# Patient Record
Sex: Male | Born: 1960 | Race: White | Hispanic: No | Marital: Married | Smoking: Former smoker
Health system: Southern US, Community
[De-identification: ages and names within clinical notes are randomized; demographics above are authoritative.]

## PROBLEM LIST (undated history)

## (undated) DIAGNOSIS — I861 Scrotal varices: Secondary | ICD-10-CM

## (undated) DIAGNOSIS — E785 Hyperlipidemia, unspecified: Secondary | ICD-10-CM

## (undated) DIAGNOSIS — I10 Essential (primary) hypertension: Secondary | ICD-10-CM

## (undated) HISTORY — PX: OTHER SURGICAL HISTORY: SHX169

## (undated) HISTORY — DX: Essential (primary) hypertension: I10

## (undated) HISTORY — PX: BACK SURGERY: SHX140

## (undated) HISTORY — DX: Hyperlipidemia, unspecified: E78.5

## (undated) HISTORY — DX: Scrotal varices: I86.1

---

## 1998-01-01 ENCOUNTER — Ambulatory Visit (HOSPITAL_COMMUNITY): Admission: RE | Admit: 1998-01-01 | Discharge: 1998-01-01 | Payer: Self-pay | Admitting: Gynecology

## 2009-02-13 ENCOUNTER — Encounter: Admission: RE | Admit: 2009-02-13 | Discharge: 2009-02-13 | Payer: Self-pay | Admitting: Sports Medicine

## 2010-01-04 ENCOUNTER — Ambulatory Visit: Payer: Self-pay | Admitting: Emergency Medicine

## 2010-01-04 DIAGNOSIS — I10 Essential (primary) hypertension: Secondary | ICD-10-CM | POA: Insufficient documentation

## 2010-04-01 NOTE — Assessment & Plan Note (Signed)
Summary: VIRUS?/TM   Vital Signs:  Patient Profile:   50 Years Old Male CC:      eye pain/pressure, sinus pressure Height:     71 inches Weight:      199 pounds O2 Sat:      99 % O2 treatment:    Room Air Temp:     98.7 degrees F oral Pulse rate:   79 / minute Resp:     16 per minute BP sitting:   138 / 88  (left arm) Cuff size:   regular  Pt. in pain?   no  Vitals Entered By: Lajean Saver RN (January 04, 2010 10:30 AM)                   Updated Prior Medication List: LISINOPRIL 10 MG TABS (LISINOPRIL) once daily CLARITIN 10 MG TABS (LORATADINE) once daily PROPECIA 1 MG TABS (FINASTERIDE)   Current Allergies: ! * DUST, POLLENHistory of Present Illness History from: patient Chief Complaint: eye pain/pressure, sinus pressure History of Present Illness: Patient complains of onset of cold symptoms for 4 days.  They have been using Claritin and Benedryl which is helping a little bit.  His wife has similar symptoms and was given Amox 2 days ago here. + sore throat + cough No pleuritic pain No wheezing +  nasal congestion No post-nasal drainage + sinus pain/pressure + itchy/red eyes No earache No hemoptysis No SOB No chills/sweats No fever No nausea No vomiting No abdominal pain No diarrhea No skin rashes No fatigue No myalgias No headache   REVIEW OF SYSTEMS Constitutional Symptoms      Denies fever, chills, night sweats, weight loss, weight gain, and fatigue.  Eyes       Complains of eye pain.      Denies change in vision, eye discharge, glasses, contact lenses, and eye surgery.      Comments: eye pressure Ear/Nose/Throat/Mouth       Complains of frequent runny nose and sinus problems.      Denies hearing loss/aids, change in hearing, ear pain, ear discharge, dizziness, frequent nose bleeds, sore throat, hoarseness, and tooth pain or bleeding.  Respiratory       Denies dry cough, productive cough, wheezing, shortness of breath, asthma, bronchitis, and  emphysema/COPD.  Cardiovascular       Denies murmurs, chest pain, and tires easily with exhertion.    Gastrointestinal       Denies stomach pain, nausea/vomiting, diarrhea, constipation, blood in bowel movements, and indigestion. Genitourniary       Denies painful urination, kidney stones, and loss of urinary control. Neurological       Denies paralysis, seizures, and fainting/blackouts. Musculoskeletal       Denies muscle pain, joint pain, joint stiffness, decreased range of motion, redness, swelling, muscle weakness, and gout.  Skin       Denies bruising, unusual mles/lumps or sores, and hair/skin or nail changes.  Psych       Denies mood changes, temper/anger issues, anxiety/stress, speech problems, depression, and sleep problems. Other Comments: Symptoms x yesterday, patine's wife was seen 2 days ago for same symptoms   Past History:  Past Medical History: Hypertension  Past Surgical History: Ear Sx Radical mastoidectomy 1978 and 1983  Family History: None  Social History: Never Smoked Alcohol use-yes 6/week Drug use-no Smoking Status:  never Drug Use:  no Physical Exam General appearance: well developed, well nourished, no acute distress Ears: normal, no lesions or deformities Nasal: mucosa  pink, nonedematous, no septal deviation, turbinates normal Oral/Pharynx: tongue normal, posterior pharynx without erythema or exudate Neck: neck supple,  trachea midline, no masses Chest/Lungs: no rales, wheezes, or rhonchi bilateral, breath sounds equal without effort Heart: regular rate and  rhythm, no murmur Skin: no obvious rashes or lesions MSE: oriented to time, place, and person Assessment New Problems: UPPER RESPIRATORY INFECTION, ACUTE (ICD-465.9) HYPERTENSION (ICD-401.9)   Patient Education: Patient and/or caregiver instructed in the following: rest, fluids, Ibuprofen prn.  Plan New Medications/Changes: AUGMENTIN 875-125 MG TABS (AMOXICILLIN-POT CLAVULANATE)  1 tab by mouth two times a day for 10 days  #20 x 0, 01/04/2010, Hoyt Koch MD  New Orders: New Patient Level III [phone] Planning Comments:   1)  Take the prescribed antibiotic as instructed. 2)  Use nasal saline solution (over the counter) at least 3 times a day. 3)  Use over the counter decongestants like Zyrtec-D every 12 hours as needed to help with congestion. 4)  Can take tylenol every 6 hours or motrin every 8 hours for pain or fever. 5)  Follow up with your primary doctor  if no improvement in 5-7 days, sooner if increasing pain, fever, or new symptoms.     The patient and/or caregiver has been counseled thoroughly with regard to medications prescribed including dosage, schedule, interactions, rationale for use, and possible side effects and they verbalize understanding.  Diagnoses and expected course of recovery discussed and will return if not improved as expected or if the condition worsens. Patient and/or caregiver verbalized understanding.  Prescriptions: AUGMENTIN 875-125 MG TABS (AMOXICILLIN-POT CLAVULANATE) 1 tab by mouth two times a day for 10 days  #20 x 0   Entered and Authorized by:   Hoyt Koch MD   Signed by:   Hoyt Koch MD on 01/04/2010   Method used:   Print then Give to Patient   RxID:   [id number]   Orders Added: 1)  New Patient Level III [40347]

## 2011-03-05 ENCOUNTER — Other Ambulatory Visit: Payer: Self-pay | Admitting: Gastroenterology

## 2011-03-05 DIAGNOSIS — R1011 Right upper quadrant pain: Secondary | ICD-10-CM

## 2011-03-09 ENCOUNTER — Ambulatory Visit
Admission: RE | Admit: 2011-03-09 | Discharge: 2011-03-09 | Disposition: A | Discharge status: Home / Self Care | Payer: 59 | Source: Ambulatory Visit | Attending: Gastroenterology | Admitting: Gastroenterology

## 2011-03-09 DIAGNOSIS — R1011 Right upper quadrant pain: Secondary | ICD-10-CM

## 2011-04-01 ENCOUNTER — Other Ambulatory Visit: Payer: Self-pay | Admitting: Gastroenterology

## 2011-04-01 DIAGNOSIS — N281 Cyst of kidney, acquired: Secondary | ICD-10-CM

## 2011-04-01 DIAGNOSIS — R1011 Right upper quadrant pain: Secondary | ICD-10-CM

## 2011-04-06 ENCOUNTER — Other Ambulatory Visit: Payer: 59

## 2011-08-31 ENCOUNTER — Ambulatory Visit: Payer: 59 | Attending: Family Medicine | Admitting: Physical Therapy

## 2011-08-31 DIAGNOSIS — M545 Low back pain, unspecified: Secondary | ICD-10-CM | POA: Insufficient documentation

## 2011-08-31 DIAGNOSIS — M2560 Stiffness of unspecified joint, not elsewhere classified: Secondary | ICD-10-CM | POA: Insufficient documentation

## 2011-08-31 DIAGNOSIS — IMO0001 Reserved for inherently not codable concepts without codable children: Secondary | ICD-10-CM | POA: Insufficient documentation

## 2011-08-31 DIAGNOSIS — M6281 Muscle weakness (generalized): Secondary | ICD-10-CM | POA: Insufficient documentation

## 2011-09-02 ENCOUNTER — Ambulatory Visit: Payer: 59 | Admitting: Physical Therapy

## 2011-09-07 ENCOUNTER — Ambulatory Visit: Payer: 59 | Admitting: Physical Therapy

## 2011-09-11 ENCOUNTER — Ambulatory Visit: Payer: 59 | Admitting: Physical Therapy

## 2011-09-15 ENCOUNTER — Ambulatory Visit: Payer: 59 | Admitting: Physical Therapy

## 2011-09-18 ENCOUNTER — Ambulatory Visit: Payer: 59 | Admitting: Physical Therapy

## 2011-09-21 ENCOUNTER — Encounter: Payer: 59 | Admitting: Physical Therapy

## 2011-09-22 ENCOUNTER — Ambulatory Visit: Payer: 59 | Admitting: Physical Therapy

## 2011-09-23 ENCOUNTER — Encounter: Payer: 59 | Admitting: Physical Therapy

## 2011-09-24 ENCOUNTER — Ambulatory Visit: Payer: 59 | Admitting: Physical Therapy

## 2014-01-11 ENCOUNTER — Other Ambulatory Visit: Payer: Self-pay | Admitting: Gastroenterology

## 2014-01-11 DIAGNOSIS — R935 Abnormal findings on diagnostic imaging of other abdominal regions, including retroperitoneum: Secondary | ICD-10-CM

## 2014-02-05 LAB — HM COLONOSCOPY

## 2014-02-12 ENCOUNTER — Ambulatory Visit
Admission: RE | Admit: 2014-02-12 | Discharge: 2014-02-12 | Disposition: A | Discharge status: Home / Self Care | Payer: 59 | Source: Ambulatory Visit | Attending: Gastroenterology | Admitting: Gastroenterology

## 2014-02-12 DIAGNOSIS — R935 Abnormal findings on diagnostic imaging of other abdominal regions, including retroperitoneum: Secondary | ICD-10-CM

## 2014-10-17 ENCOUNTER — Emergency Department (HOSPITAL_BASED_OUTPATIENT_CLINIC_OR_DEPARTMENT_OTHER)
Admission: EM | Admit: 2014-10-17 | Discharge: 2014-10-17 | Disposition: A | Discharge status: Home / Self Care | Payer: 59 | Attending: Emergency Medicine | Admitting: Emergency Medicine

## 2014-10-17 DIAGNOSIS — S91115A Laceration without foreign body of left lesser toe(s) without damage to nail, initial encounter: Secondary | ICD-10-CM | POA: Insufficient documentation

## 2014-10-17 DIAGNOSIS — W228XXA Striking against or struck by other objects, initial encounter: Secondary | ICD-10-CM | POA: Diagnosis not present

## 2014-10-17 DIAGNOSIS — Y998 Other external cause status: Secondary | ICD-10-CM | POA: Diagnosis not present

## 2014-10-17 DIAGNOSIS — Z23 Encounter for immunization: Secondary | ICD-10-CM | POA: Diagnosis not present

## 2014-10-17 DIAGNOSIS — Y9389 Activity, other specified: Secondary | ICD-10-CM | POA: Insufficient documentation

## 2014-10-17 DIAGNOSIS — Y9289 Other specified places as the place of occurrence of the external cause: Secondary | ICD-10-CM | POA: Diagnosis not present

## 2014-10-17 DIAGNOSIS — S91119A Laceration without foreign body of unspecified toe without damage to nail, initial encounter: Secondary | ICD-10-CM

## 2014-10-17 DIAGNOSIS — Z79899 Other long term (current) drug therapy: Secondary | ICD-10-CM | POA: Diagnosis not present

## 2014-10-17 MED ORDER — LIDOCAINE HCL (PF) 1 % IJ SOLN
5.0000 mL | Freq: Once | INTRAMUSCULAR | Status: AC
  Administered 2014-10-17: 5 mL via INTRADERMAL
  Filled 2014-10-17: qty 5

## 2014-10-17 MED ORDER — TETANUS-DIPHTH-ACELL PERTUSSIS 5-2.5-18.5 LF-MCG/0.5 IM SUSP
0.5000 mL | Freq: Once | INTRAMUSCULAR | Status: AC
  Administered 2014-10-17: 0.5 mL via INTRAMUSCULAR
  Filled 2014-10-17: qty 0.5

## 2014-10-17 NOTE — ED Notes (Signed)
Pt states that he hit left pinky toe on top of toilet seat that was down on floor, bleeding controlled

## 2014-10-17 NOTE — Discharge Instructions (Signed)
Suture removal 7-10 days. ° °Laceration Care, Adult °A laceration is a cut that goes through all layers of the skin. The cut goes into the tissue beneath the skin. °HOME CARE °For stitches (sutures) or staples: °· Keep the cut clean and dry. °· If you have a bandage (dressing), change it at least once a day. Change the bandage if it gets wet or dirty, or as told by your doctor. °· Wash the cut with soap and water 2 times a day. Rinse the cut with water. Pat it dry with a clean towel. °· Put a thin layer of medicated cream on the cut as told by your doctor. °· You may shower after the first 24 hours. Do not soak the cut in water until the stitches are removed. °· Only take medicines as told by your doctor. °· Have your stitches or staples removed as told by your doctor. °For skin adhesive strips: °· Keep the cut clean and dry. °· Do not get the strips wet. You may take a bath, but be careful to keep the cut dry. °· If the cut gets wet, pat it dry with a clean towel. °· The strips will fall off on their own. Do not remove the strips that are still stuck to the cut. °For wound glue: °· You may shower or take baths. Do not soak or scrub the cut. Do not swim. Avoid heavy sweating until the glue falls off on its own. After a shower or bath, pat the cut dry with a clean towel. °· Do not put medicine on your cut until the glue falls off. °· If you have a bandage, do not put tape over the glue. °· Avoid lots of sunlight or tanning lamps until the glue falls off. Put sunscreen on the cut for the first year to reduce your scar. °· The glue will fall off on its own. Do not pick at the glue. °You may need a tetanus shot if: °· You cannot remember when you had your last tetanus shot. °· You have never had a tetanus shot. °If you need a tetanus shot and you choose not to have one, you may get tetanus. Sickness from tetanus can be serious. °GET HELP RIGHT AWAY IF:  °· Your pain does not get better with medicine. °· Your arm, hand,  leg, or foot loses feeling (numbness) or changes color. °· Your cut is bleeding. °· Your joint feels weak, or you cannot use your joint. °· You have painful lumps on your body. °· Your cut is red, puffy (swollen), or painful. °· You have a red line on the skin near the cut. °· You have yellowish-white fluid (pus) coming from the cut. °· You have a fever. °· You have a bad smell coming from the cut or bandage. °· Your cut breaks open before or after stitches are removed. °· You notice something coming out of the cut, such as wood or glass. °· You cannot move a finger or toe. °MAKE SURE YOU:  °· Understand these instructions. °· Will watch your condition. °· Will get help right away if you are not doing well or get worse. °Document Released: 08/05/2007 Document Revised: 05/11/2011 Document Reviewed: 08/12/2010 °[employer]® Patient Information ©2015 [employer]. This information is not intended to replace advice given to you by your health care provider. Make sure you discuss any questions you have with your health care provider. ° °

## 2014-10-17 NOTE — ED Provider Notes (Signed)
CSN: [id number]     Arrival date & time 10/17/14  7829 History   First MD Initiated Contact with Patient 10/17/14 0654     Chief Complaint  Patient presents with  . Laceration      HPI  Patient presents with a laceration to the left small toe after striking it on the edge of a piece of toilet that was being repaired. Bleeding at home. Controlled with pressure.  No past medical history on file. No past surgical history on file. No family history on file. Social History  Substance Use Topics  . Smoking status: Not on file  . Smokeless tobacco: Not on file  . Alcohol Use: Not on file    Review of Systems  Constitutional: Negative for fever, chills, diaphoresis, appetite change and fatigue.  HENT: Negative for mouth sores, sore throat and trouble swallowing.   Eyes: Negative for visual disturbance.  Respiratory: Negative for cough, chest tightness, shortness of breath and wheezing.   Cardiovascular: Negative for chest pain.  Gastrointestinal: Negative for nausea, vomiting, abdominal pain, diarrhea and abdominal distention.  Endocrine: Negative for polydipsia, polyphagia and polyuria.  Genitourinary: Negative for dysuria, frequency and hematuria.  Musculoskeletal: Negative for gait problem.  Skin: Positive for wound. Negative for color change, pallor and rash.  Neurological: Negative for dizziness, syncope, light-headedness and headaches.  Hematological: Does not bruise/bleed easily.  Psychiatric/Behavioral: Negative for behavioral problems and confusion.      Allergies  Review of patient's allergies indicates not on file.  Home Medications   Prior to Admission medications   Medication Sig Start Date End Date Taking? Authorizing Provider  finasteride (PROSCAR) 5 MG tablet Take 5 mg by mouth daily.   Yes Historical Provider, MD  lansoprazole (PREVACID) 30 MG capsule Take 30 mg by mouth daily at 12 noon.   Yes Historical Provider, MD  lisinopril (PRINIVIL,ZESTRIL) 20 MG  tablet Take 20 mg by mouth daily.   Yes Historical Provider, MD   BP 131/87 mmHg  Pulse 71  Temp(Src) 98.2 F (36.8 C) (Oral)  Resp 16  Ht  (1.803 m)  Wt 190 lb (86.183 kg)  BMI 26.51 kg/m2  SpO2 99% Physical Exam  Constitutional: He is oriented to person, place, and time. He appears well-developed and well-nourished. No distress.  HENT:  Head: Normocephalic.  Eyes: Conjunctivae are normal. Pupils are equal, round, and reactive to light. No scleral icterus.  Neck: Normal range of motion. Neck supple. No thyromegaly present.  Cardiovascular: Normal rate and regular rhythm.  Exam reveals no gallop and no friction rub.   No murmur heard. Pulmonary/Chest: Effort normal and breath sounds normal. No respiratory distress. He has no wheezes. He has no rales.  Abdominal: Soft. Bowel sounds are normal. He exhibits no distension. There is no tenderness. There is no rebound.  Musculoskeletal: Normal range of motion.  Neurological: He is alert and oriented to person, place, and time.  Skin: Skin is warm and dry. No rash noted.  Laceration to the left fifth toe. Adjacent the nail medially extending to the distal aspect  Psychiatric: He has a normal mood and affect. His behavior is normal.    ED Course  Procedures (including critical care time) Labs Review Labs Reviewed - No data to display  Imaging Review No results found. I have personally reviewed and evaluated these images and lab results as part of my medical decision-making.   EKG Interpretation None      MDM   Final diagnoses:  Toe laceration,  initial encounter    Nail and bed are intact. No deformity. After anesthesia the toe was prepped and draped. Tourniquet was applied. Explored. No obvious bony deformity. No bone fragments. No instability.  LACERATION REPAIR Performed by: Claudean Kinds Authorized by: Claudean Kinds Consent: Verbal consent obtained. Risks and benefits: risks, benefits and alternatives  were discussed Consent given by: patient Patient identity confirmed: provided demographic data Prepped and Draped in normal sterile fashion Wound explored  Laceration Location: lt 5th toe  Laceration Length: 1cm  No Foreign Bodies seen or palpated  Anesthesia: local infiltration  Local anesthetic: lidocaine 1% c epinephrine  Anesthetic total: 4 ml  Irrigation method: syringe Amount of cleaning: standard  Skin closure: 5-0 Prolene  Number of sutures: 4  Technique: simple interrumpted  Patient tolerance: Patient tolerated the procedure well with no immediate complications.     Rolland Porter, MD 10/17/14 [phone]

## 2015-06-12 LAB — CBC AND DIFFERENTIAL
HCT: 48 (ref 41–53)
Hemoglobin: 15.3 (ref 13.5–17.5)
Platelets: 206 (ref 150–399)
WBC: 5.9

## 2015-06-12 LAB — HEPATIC FUNCTION PANEL
ALT: 24 (ref 10–40)
AST: 19 (ref 14–40)
Alkaline Phosphatase: 66 (ref 25–125)

## 2015-06-12 LAB — LIPID PANEL
CHOLESTEROL: 248 — AB (ref 0–200)
HDL: 72 — AB (ref 35–70)
LDL Cholesterol: 158
TRIGLYCERIDES: 91 (ref 40–160)

## 2015-06-12 LAB — BASIC METABOLIC PANEL
BUN: 13 (ref 4–21)
CREATININE: 0.9 (ref 0.6–1.3)
Glucose: 93
POTASSIUM: 4.4 (ref 3.4–5.3)
Sodium: 143 (ref 137–147)

## 2015-08-02 ENCOUNTER — Other Ambulatory Visit: Payer: Self-pay | Admitting: Gastroenterology

## 2015-08-02 DIAGNOSIS — R1011 Right upper quadrant pain: Secondary | ICD-10-CM

## 2015-08-13 ENCOUNTER — Ambulatory Visit
Admission: RE | Admit: 2015-08-13 | Discharge: 2015-08-13 | Disposition: A | Discharge status: Home / Self Care | Payer: 59 | Source: Ambulatory Visit | Attending: Gastroenterology | Admitting: Gastroenterology

## 2015-08-13 DIAGNOSIS — R1011 Right upper quadrant pain: Secondary | ICD-10-CM

## 2016-02-08 DIAGNOSIS — J111 Influenza due to unidentified influenza virus with other respiratory manifestations: Secondary | ICD-10-CM | POA: Diagnosis not present

## 2016-02-18 DIAGNOSIS — J069 Acute upper respiratory infection, unspecified: Secondary | ICD-10-CM | POA: Diagnosis not present

## 2016-03-02 HISTORY — PX: OTHER SURGICAL HISTORY: SHX169

## 2016-03-19 DIAGNOSIS — M79674 Pain in right toe(s): Secondary | ICD-10-CM | POA: Diagnosis not present

## 2016-03-19 DIAGNOSIS — M79675 Pain in left toe(s): Secondary | ICD-10-CM | POA: Diagnosis not present

## 2016-03-27 DIAGNOSIS — Z23 Encounter for immunization: Secondary | ICD-10-CM | POA: Diagnosis not present

## 2016-05-11 DIAGNOSIS — I10 Essential (primary) hypertension: Secondary | ICD-10-CM | POA: Diagnosis not present

## 2016-05-11 DIAGNOSIS — L209 Atopic dermatitis, unspecified: Secondary | ICD-10-CM | POA: Diagnosis not present

## 2016-05-25 DIAGNOSIS — J069 Acute upper respiratory infection, unspecified: Secondary | ICD-10-CM | POA: Diagnosis not present

## 2016-05-25 DIAGNOSIS — R05 Cough: Secondary | ICD-10-CM | POA: Diagnosis not present

## 2016-09-07 DIAGNOSIS — G8929 Other chronic pain: Secondary | ICD-10-CM | POA: Diagnosis not present

## 2016-09-07 DIAGNOSIS — M2022 Hallux rigidus, left foot: Secondary | ICD-10-CM | POA: Diagnosis not present

## 2016-09-07 DIAGNOSIS — M79672 Pain in left foot: Secondary | ICD-10-CM | POA: Diagnosis not present

## 2016-10-27 DIAGNOSIS — G8918 Other acute postprocedural pain: Secondary | ICD-10-CM | POA: Diagnosis not present

## 2016-10-27 DIAGNOSIS — M2022 Hallux rigidus, left foot: Secondary | ICD-10-CM | POA: Diagnosis not present

## 2016-12-09 DIAGNOSIS — M2022 Hallux rigidus, left foot: Secondary | ICD-10-CM | POA: Diagnosis not present

## 2017-01-07 ENCOUNTER — Encounter: Payer: Self-pay | Admitting: Family Medicine

## 2017-01-07 ENCOUNTER — Ambulatory Visit: Payer: BLUE CROSS/BLUE SHIELD | Admitting: Family Medicine

## 2017-01-07 VITALS — BP 128/75 | HR 73 | Ht 70.5 in | Wt 195.0 lb

## 2017-01-07 DIAGNOSIS — Z125 Encounter for screening for malignant neoplasm of prostate: Secondary | ICD-10-CM

## 2017-01-07 DIAGNOSIS — I10 Essential (primary) hypertension: Secondary | ICD-10-CM | POA: Diagnosis not present

## 2017-01-07 DIAGNOSIS — B009 Herpesviral infection, unspecified: Secondary | ICD-10-CM | POA: Diagnosis not present

## 2017-01-07 DIAGNOSIS — K2100 Gastro-esophageal reflux disease with esophagitis, without bleeding: Secondary | ICD-10-CM

## 2017-01-07 DIAGNOSIS — R0981 Nasal congestion: Secondary | ICD-10-CM

## 2017-01-07 DIAGNOSIS — K1370 Unspecified lesions of oral mucosa: Secondary | ICD-10-CM | POA: Diagnosis not present

## 2017-01-07 MED ORDER — VALACYCLOVIR HCL 1 G PO TABS: 1000.0000 mg | ORAL_TABLET | Freq: Two times a day (BID) | ORAL | 5 refills | Status: DC | PRN

## 2017-01-07 NOTE — Progress Notes (Signed)
Subjective:    Patient ID: Ethan Bennett, male    DOB: 15-Apr-1960, 56 y.o.   MRN: [id number]  HPI 56 year old male is here today to establish care.  His wife Consuella Loselaine and son are current patient's here.  He has a history of high blood pressure and has been on medication for several years.  He is currently on 10 mg and is doing well on that without any problems.  He also reports a history of chronic hair loss and has been on finasteride for about the last 8 years.  He splits the tab.  Reflux/GERD-he was diagnosed formally about 2 years ago but actually is probably had symptoms on and off for about the last 5 years.  He does go on a PPI intermittently and sometimes has symptoms at night  In particular today he would like to discuss some sores that he breaks out with on his mouth and tongue.  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  He said that since he was about 56 years old but starting about 8 years ago he started noticing that he would occasionally get a breakout of what feel almost like cuts in his mouth and his throat particularly on the tongue and throat.  He says the acyclovir which normally helps his cold sores does not seem to improve the mouth symptoms and that these break out at different times.  He gets about 4 episodes per year.  He often gets associated nasal congestion and excess mucus production around that time.  His wife would like him checked for CMV and Epstein-Barr virus.   Review of Systems  Constitutional: Negative for diaphoresis, fever and unexpected weight change.  HENT: Negative for hearing loss, rhinorrhea, sneezing and tinnitus.   Eyes: Negative for visual disturbance.  Respiratory: Negative for cough and wheezing.   Cardiovascular: Negative for chest pain and palpitations.  Gastrointestinal: Negative for blood in stool, diarrhea, nausea and vomiting.  Genitourinary: Negative for discharge and dysuria.  Musculoskeletal: Negative  for arthralgias and myalgias.  Skin: Negative for rash.  Neurological: Negative for headaches.  Hematological: Negative for adenopathy.  Psychiatric/Behavioral: Negative for dysphoric mood and sleep disturbance. The patient is not nervous/anxious.      BP 128/75   Pulse 73   Ht 5' 10.5" (1.791 m)   Wt 195 lb (88.5 kg)   BMI 27.58 kg/m     Allergies not on file  Past Medical History:  Diagnosis Date  . Hyperlipidemia   . Hypertension     Past Surgical History:  Procedure Laterality Date  . radical mastoidectomy      Social History   Socioeconomic History  . Marital status: Married    Spouse name: Not on file  . Number of children: Not on file  . Years of education: Not on file  . Highest education level: Not on file  Social Needs  . Financial resource strain: Not on file  . Food insecurity - worry: Not on file  . Food insecurity - inability: Not on file  . Transportation needs - medical: Not on file  . Transportation needs - non-medical: Not on file  Occupational History  . Not on file  Tobacco Use  . Smoking status: Former Smoker    Last attempt to quit: 01/07/1981    Years since quitting: 36.0  . Smokeless tobacco: Former Engineer, [employer]  Substance and Sexual Activity  . Alcohol use: Yes    Comment: 8-10  drinks a week   . Drug use: No  . Sexual activity: Yes  Other Topics Concern  . Not on file  Social History Narrative  . Not on file    No family history on file.  Outpatient Encounter Medications as of 01/07/2017  Medication Sig  . finasteride (PROSCAR) 5 MG tablet Take 5 mg by mouth daily.  . lansoprazole (PREVACID) 30 MG capsule Take 30 mg by mouth daily at 12 noon.  Marland Kitchen. lisinopril (PRINIVIL,ZESTRIL) 20 MG tablet Take 20 mg by mouth daily.  . valACYclovir (VALTREX) 1000 MG tablet Take 1 tablet (1,000 mg total) 2 (two) times daily as needed by mouth.   No facility-administered encounter medications on file as of 01/07/2017.          Objective:   Physical  Exam  Constitutional: He is oriented to person, place, and time. He appears well-developed and well-nourished.  HENT:  Head: Normocephalic and atraumatic.  Right Ear: External ear normal.  Left Ear: External ear normal.  He has 2 small ulcerations just under the bottom edge of the tongue on the left side.  Please see photograph.  He does have a her B simplex lesion on his lower lip as well.  Eyes: Conjunctivae are normal.  Cardiovascular: Normal rate, regular rhythm and normal heart sounds.  No carotid bruits.  Pulmonary/Chest: Effort normal and breath sounds normal.  Neurological: He is alert and oriented to person, place, and time.  Skin: Skin is warm and dry.  Psychiatric: He has a normal mood and affect. His behavior is normal.             Assessment & Plan:  HTN -controlled.  Continue current regimen of lisinopril 10 mg daily.  Will order CMP and lipid panel.  GERD -tries to limit his PPI use but does use them periodically. Orals lesions -unclear.  These do not seem to respond the same to the antiviral medication.  In the started much later than his original herpes simplex outbreak.  He does not have any genital lesions per his report so less likely to be but shots.  He can certainly try some over-the-counter lysine to see if this makes a difference as well.  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  He did well with this in the past and only switched to acyclovir because of his insurance.  Prostate cancer screening-we will check PSA.  Hair loss-continue finasteride.

## 2017-01-07 NOTE — Patient Instructions (Addendum)
Can try over the counter lysine for the oral sores.

## 2017-01-12 DIAGNOSIS — B009 Herpesviral infection, unspecified: Secondary | ICD-10-CM | POA: Diagnosis not present

## 2017-01-12 DIAGNOSIS — K1370 Unspecified lesions of oral mucosa: Secondary | ICD-10-CM | POA: Diagnosis not present

## 2017-01-12 DIAGNOSIS — Z125 Encounter for screening for malignant neoplasm of prostate: Secondary | ICD-10-CM | POA: Diagnosis not present

## 2017-01-12 LAB — PSA: PSA: 0.1 ng/mL (ref <=4.0)

## 2017-01-15 LAB — COMPLETE METABOLIC PANEL WITH GFR
AG Ratio: 1.6 (calc) (ref 1.0–2.5)
ALBUMIN MSPROF: 4.1 g/dL (ref 3.6–5.1)
ALT: 18 U/L (ref 9–46)
AST: 15 U/L (ref 10–35)
Alkaline phosphatase (APISO): 72 U/L (ref 40–115)
BILIRUBIN TOTAL: 0.6 mg/dL (ref 0.2–1.2)
BUN: 16 mg/dL (ref 7–25)
CHLORIDE: 102 mmol/L (ref 98–110)
CO2: 33 mmol/L — ABNORMAL HIGH (ref 20–32)
CREATININE: 0.96 mg/dL (ref 0.70–1.33)
Calcium: 9.5 mg/dL (ref 8.6–10.3)
GFR, EST AFRICAN AMERICAN: 102 mL/min/{1.73_m2} (ref >=60)
GFR, Est Non African American: 88 mL/min/{1.73_m2} (ref >=60)
GLOBULIN: 2.6 g/dL (ref 1.9–3.7)
GLUCOSE: 105 mg/dL — AB (ref 65–99)
Potassium: 4.6 mmol/L (ref 3.5–5.3)
SODIUM: 138 mmol/L (ref 135–146)
TOTAL PROTEIN: 6.7 g/dL (ref 6.1–8.1)

## 2017-01-15 LAB — CBC WITH DIFFERENTIAL/PLATELET
BASOS PCT: 1.1 %
Basophils Absolute: 57 cells/uL (ref 0–200)
Eosinophils Absolute: 296 cells/uL (ref 15–500)
Eosinophils Relative: 5.7 %
HCT: 43.1 % (ref 38.5–50.0)
Hemoglobin: 14.8 g/dL (ref 13.2–17.1)
Lymphs Abs: 2252 cells/uL (ref 850–3900)
MCH: 31.2 pg (ref 27.0–33.0)
MCHC: 34.3 g/dL (ref 32.0–36.0)
MCV: 90.9 fL (ref 80.0–100.0)
MONOS PCT: 10.7 %
MPV: 12.3 fL (ref 7.5–12.5)
Neutro Abs: 2038 cells/uL (ref 1500–7800)
Neutrophils Relative %: 39.2 %
PLATELETS: 231 10*3/uL (ref 140–400)
RBC: 4.74 10*6/uL (ref 4.20–5.80)
RDW: 12.9 % (ref 11.0–15.0)
TOTAL LYMPHOCYTE: 43.3 %
WBC mixed population: 556 cells/uL (ref 200–950)
WBC: 5.2 10*3/uL (ref 3.8–10.8)

## 2017-01-15 LAB — LIPID PANEL
CHOL/HDL RATIO: 3.4 (calc) (ref <5.0)
Cholesterol: 230 mg/dL — ABNORMAL HIGH (ref <200)
HDL: 67 mg/dL (ref >40)
LDL CHOLESTEROL (CALC): 137 mg/dL — AB
Non-HDL Cholesterol (Calc): 163 mg/dL (calc) — ABNORMAL HIGH (ref <130)
Triglycerides: 140 mg/dL (ref <150)

## 2017-01-15 LAB — EPSTEIN-BARR VIRUS VCA ANTIBODY PANEL
EBV NA IgG: 25.9 U/mL — ABNORMAL HIGH
EBV VCA IgG: 202 U/mL — ABNORMAL HIGH
EBV VCA IgM: <36 U/mL

## 2017-01-15 LAB — CMV ABS, IGG+IGM (CYTOMEGALOVIRUS)
CMV IgM: <30 AU/mL
Cytomegalovirus Ab-IgG: <0.6 U/mL

## 2017-01-15 LAB — HEMOGLOBIN A1C
EAG (MMOL/L): 5.5 (calc)
Hgb A1c MFr Bld: 5.1 % of total Hgb (ref <5.7)
MEAN PLASMA GLUCOSE: 100 (calc)

## 2017-01-15 LAB — C-REACTIVE PROTEIN: CRP: 1.8 mg/L (ref <8.0)

## 2017-01-15 LAB — SEDIMENTATION RATE: SED RATE: 9 mm/h (ref 0–20)

## 2017-03-29 ENCOUNTER — Telehealth: Payer: Self-pay | Admitting: Family Medicine

## 2017-03-29 MED ORDER — LISINOPRIL 20 MG PO TABS: 20.0000 mg | ORAL_TABLET | Freq: Every day | ORAL | 2 refills | Status: DC

## 2017-03-29 NOTE — Telephone Encounter (Signed)
Pt advised.

## 2017-03-29 NOTE — Telephone Encounter (Signed)
Pt established with [name] in November. States he is out of BP Rx. Requesting refill. Last entered by historical Provider, routing.

## 2017-03-29 NOTE — Telephone Encounter (Signed)
Rx for lisinopril sent

## 2017-06-10 DIAGNOSIS — H93A2 Pulsatile tinnitus, left ear: Secondary | ICD-10-CM | POA: Diagnosis not present

## 2017-06-10 DIAGNOSIS — H9072 Mixed conductive and sensorineural hearing loss, unilateral, left ear, with unrestricted hearing on the contralateral side: Secondary | ICD-10-CM | POA: Diagnosis not present

## 2017-06-10 DIAGNOSIS — H6522 Chronic serous otitis media, left ear: Secondary | ICD-10-CM | POA: Diagnosis not present

## 2017-06-10 DIAGNOSIS — H95192 Other disorders following mastoidectomy, left ear: Secondary | ICD-10-CM | POA: Diagnosis not present

## 2017-07-01 DIAGNOSIS — H95192 Other disorders following mastoidectomy, left ear: Secondary | ICD-10-CM | POA: Diagnosis not present

## 2017-07-01 DIAGNOSIS — H9072 Mixed conductive and sensorineural hearing loss, unilateral, left ear, with unrestricted hearing on the contralateral side: Secondary | ICD-10-CM | POA: Diagnosis not present

## 2017-07-08 ENCOUNTER — Encounter: Payer: Self-pay | Admitting: Family Medicine

## 2017-07-14 ENCOUNTER — Other Ambulatory Visit: Payer: Self-pay | Admitting: *Deleted

## 2017-07-19 ENCOUNTER — Telehealth: Payer: Self-pay | Admitting: Family Medicine

## 2017-07-19 MED ORDER — FINASTERIDE 5 MG PO TABS: 5.0000 mg | ORAL_TABLET | Freq: Every day | ORAL | 1 refills | Status: DC

## 2017-07-19 NOTE — Telephone Encounter (Signed)
Historical provider.  

## 2017-07-19 NOTE — Telephone Encounter (Signed)
Ethan Bennett checked with the pharmacy a couple times and they don't have anything for me. Can you check to see if this has been called in to [facility]? If not, can you make sure to have them call it in today? (REFILL ON HIS FINASTERIDE)

## 2017-12-18 ENCOUNTER — Other Ambulatory Visit: Payer: Self-pay | Admitting: Family Medicine

## 2018-01-13 ENCOUNTER — Other Ambulatory Visit: Payer: Self-pay | Admitting: Family Medicine

## 2018-01-17 ENCOUNTER — Other Ambulatory Visit: Payer: Self-pay | Admitting: Family Medicine

## 2018-01-31 ENCOUNTER — Other Ambulatory Visit: Payer: Self-pay | Admitting: Family Medicine

## 2018-02-08 ENCOUNTER — Other Ambulatory Visit: Payer: Self-pay | Admitting: Family Medicine

## 2018-02-08 MED ORDER — LISINOPRIL 20 MG PO TABS: 20.0000 mg | ORAL_TABLET | Freq: Every day | ORAL | 0 refills | Status: DC

## 2018-02-16 ENCOUNTER — Ambulatory Visit (INDEPENDENT_AMBULATORY_CARE_PROVIDER_SITE_OTHER): Payer: BLUE CROSS/BLUE SHIELD | Admitting: Family Medicine

## 2018-02-16 ENCOUNTER — Encounter: Payer: Self-pay | Admitting: Family Medicine

## 2018-02-16 VITALS — BP 125/82 | HR 81 | Ht 71.0 in | Wt 202.0 lb

## 2018-02-16 DIAGNOSIS — R7309 Other abnormal glucose: Secondary | ICD-10-CM

## 2018-02-16 DIAGNOSIS — Z1159 Encounter for screening for other viral diseases: Secondary | ICD-10-CM | POA: Diagnosis not present

## 2018-02-16 DIAGNOSIS — Z125 Encounter for screening for malignant neoplasm of prostate: Secondary | ICD-10-CM

## 2018-02-16 DIAGNOSIS — I10 Essential (primary) hypertension: Secondary | ICD-10-CM | POA: Diagnosis not present

## 2018-02-16 DIAGNOSIS — Z114 Encounter for screening for human immunodeficiency virus [HIV]: Secondary | ICD-10-CM

## 2018-02-16 MED ORDER — FINASTERIDE 5 MG PO TABS: 5.0000 mg | ORAL_TABLET | Freq: Every day | ORAL | 1 refills | Status: DC

## 2018-02-16 MED ORDER — LISINOPRIL 20 MG PO TABS: 20.0000 mg | ORAL_TABLET | Freq: Every day | ORAL | 1 refills | Status: DC

## 2018-02-16 NOTE — Assessment & Plan Note (Signed)
Well controlled. Continue current regimen. Follow up in  6 months. Due for CMP.   

## 2018-02-16 NOTE — Progress Notes (Signed)
Established Patient Office Visit  Subjective:  Patient ID: Ethan Bennett, male    DOB: 06-18-1960  Age: 57 y.o. MRN: [id number]  CC:  Chief Complaint  Patient presents with  . Hypertension    HPI Ethan Bennett presents for HTN  Hypertension- Pt denies chest pain, SOB, dizziness, or heart palpitations.  Taking meds as directed w/o problems.  Denies medication side effects.      Past Medical History:  Diagnosis Date  . Hyperlipidemia   . Hypertension   . Varicocele     Past Surgical History:  Procedure Laterality Date  . BACK SURGERY    . bilateral groin hernia    . left foot surgery   2018   bone spur   . radical mastoidectomy    . reconstructive ear surgery      Family History  Problem Relation Age of Onset  . Hypertension Mother   . Cancer - Other Father        biliary    Social History   Socioeconomic History  . Marital status: Married    Spouse name: Not on file  . Number of children: Not on file  . Years of education: Not on file  . Highest education level: Not on file  Occupational History  . Not on file  Social Needs  . Financial resource strain: Not on file  . Food insecurity:    Worry: Not on file    Inability: Not on file  . Transportation needs:    Medical: Not on file    Non-medical: Not on file  Tobacco Use  . Smoking status: Former Smoker    Last attempt to quit: 01/07/1981    Years since quitting: 37.1  . Smokeless tobacco: Former Engineer, waterUser  Substance and Sexual Activity  . Alcohol use: Yes    Comment: 8-10 drinks a week   . Drug use: No  . Sexual activity: Yes  Lifestyle  . Physical activity:    Days per week: Not on file    Minutes per session: Not on file  . Stress: Not on file  Relationships  . Social connections:    Talks on phone: Not on file    Gets together: Not on file    Attends religious service: Not on file    Active member of club or organization: Not on file    Attends meetings of clubs or organizations:  Not on file    Relationship status: Not on file  . Intimate partner violence:    Fear of current or ex partner: Not on file    Emotionally abused: Not on file    Physically abused: Not on file    Forced sexual activity: Not on file  Other Topics Concern  . Not on file  Social History Narrative  . Not on file    Outpatient Medications Prior to Visit  Medication Sig Dispense Refill  . lansoprazole (PREVACID) 30 MG capsule Take 30 mg by mouth daily at 12 noon.    . finasteride (PROSCAR) 5 MG tablet Take 1 tablet (5 mg total) by mouth daily. LAST REFILL.APPOINTMENT REQUIRED FOR REFILLS,PLEASE SCHEDULE 30 tablet 0  . lisinopril (PRINIVIL,ZESTRIL) 20 MG tablet Take 1 tablet (20 mg total) by mouth daily. 2 WEEK SUPPLY GIVIEN.MUST SCHEDULE AND KEEP APPOINTMENT FOR REFILLS 15 tablet 0  . valACYclovir (VALTREX) 1000 MG tablet Take 1 tablet (1,000 mg total) 2 (two) times daily as needed by mouth. 20 tablet 5   No facility-administered  medications prior to visit.     Not on File  ROS Review of Systems    Objective:    Physical Exam  Constitutional: He is oriented to person, place, and time. He appears well-developed and well-nourished.  HENT:  Head: Normocephalic and atraumatic.  Right Ear: External ear normal.  Left Ear: External ear normal.  Nose: Nose normal.  Mouth/Throat: Oropharynx is clear and moist.  Eyes: Pupils are equal, round, and reactive to light. Conjunctivae and EOM are normal.  Neck: Normal range of motion. Neck supple. No thyromegaly present.  Cardiovascular: Normal rate, regular rhythm, normal heart sounds and intact distal pulses.  Pulmonary/Chest: Effort normal and breath sounds normal.  Abdominal: Soft. Bowel sounds are normal. He exhibits no distension and no mass. There is no abdominal tenderness. There is no rebound and no guarding.  Musculoskeletal: Normal range of motion.  Lymphadenopathy:    He has no cervical adenopathy.  Neurological: He is alert and  oriented to person, place, and time. He has normal reflexes.  Skin: Skin is warm and dry.  Psychiatric: He has a normal mood and affect. His behavior is normal. Judgment and thought content normal.    BP 125/82   Pulse 81   Ht 5\' 11"  (1.803 m)   Wt 202 lb (91.6 kg)   SpO2 95%   BMI 28.17 kg/m  Wt Readings from Last 3 Encounters:  02/16/18 202 lb (91.6 kg)  01/07/17 195 lb (88.5 kg)  10/17/14 190 lb (86.2 kg)     Health Maintenance Due  Topic Date Due  . Hepatitis C Screening  [birth year]  . HIV Screening  07/20/1975  . COLONOSCOPY  07/20/2010    There are no preventive care reminders to display for this patient.  No results found for: TSH Lab Results  Component Value Date   WBC 5.2 01/12/2017   HGB 14.8 01/12/2017   HCT 43.1 01/12/2017   MCV 90.9 01/12/2017   PLT 231 01/12/2017   Lab Results  Component Value Date   NA 138 01/12/2017   K 4.6 01/12/2017   CO2 33 (H) 01/12/2017   GLUCOSE 105 (H) 01/12/2017   BUN 16 01/12/2017   CREATININE 0.96 01/12/2017   BILITOT 0.6 01/12/2017   ALKPHOS 66 06/12/2015   AST 15 01/12/2017   ALT 18 01/12/2017   PROT 6.7 01/12/2017   CALCIUM 9.5 01/12/2017   Lab Results  Component Value Date   CHOL 230 (H) 01/12/2017   Lab Results  Component Value Date   HDL 67 01/12/2017   Lab Results  Component Value Date   LDLCALC 137 (H) 01/12/2017   Lab Results  Component Value Date   TRIG 140 01/12/2017   Lab Results  Component Value Date   CHOLHDL 3.4 01/12/2017   Lab Results  Component Value Date   HGBA1C 5.1 01/12/2017      Assessment & Plan:   Problem List Items Addressed This Visit      Cardiovascular and Mediastinum   Essential hypertension - Primary    Well controlled. Continue current regimen. Follow up in  6 months. Due for CMP.        Relevant Medications   lisinopril (PRINIVIL,ZESTRIL) 20 MG tablet   Other Relevant Orders   COMPLETE METABOLIC PANEL WITH GFR   Lipid panel    Other Visit Diagnoses     Abnormal glucose       Relevant Orders   COMPLETE METABOLIC PANEL WITH GFR   Lipid panel  HgB A1c   Screening for prostate cancer       Relevant Orders   PSA   Encounter for hepatitis C screening test for low risk patient       Relevant Orders   Hepatitis C antibody   Screening for HIV without presence of risk factors         Discussed Shingrix vaccine.     Meds ordered this encounter  Medications  . finasteride (PROSCAR) 5 MG tablet    Sig: Take 1 tablet (5 mg total) by mouth daily.    Dispense:  90 tablet    Refill:  1  . lisinopril (PRINIVIL,ZESTRIL) 20 MG tablet    Sig: Take 1 tablet (20 mg total) by mouth daily.    Dispense:  90 tablet    Refill:  1    Follow-up: Return in about 6 months (around 08/18/2018) for BP and glucose.    Nani Gasser, MD

## 2018-02-21 ENCOUNTER — Other Ambulatory Visit: Payer: Self-pay | Admitting: Family Medicine

## 2018-03-03 ENCOUNTER — Telehealth: Payer: Self-pay | Admitting: Family Medicine

## 2018-03-03 NOTE — Telephone Encounter (Signed)
Dr Judie Petit: there is a physical on 1/26 at 7:50 (Ethan Bennett) followed by a hfu at 8:10, are you okay with this? Thanks

## 2018-03-04 NOTE — Telephone Encounter (Signed)
Thank you :)

## 2018-03-04 NOTE — Telephone Encounter (Signed)
Thank you for letting me know. It is not optimal but should be find.  Thank you.

## 2018-03-14 ENCOUNTER — Encounter: Payer: BLUE CROSS/BLUE SHIELD | Admitting: Family Medicine

## 2018-03-28 ENCOUNTER — Encounter: Payer: Self-pay | Admitting: Family Medicine

## 2018-03-28 ENCOUNTER — Ambulatory Visit (INDEPENDENT_AMBULATORY_CARE_PROVIDER_SITE_OTHER): Payer: BLUE CROSS/BLUE SHIELD | Admitting: Family Medicine

## 2018-03-28 VITALS — BP 121/74 | HR 73 | Ht 71.0 in | Wt 206.0 lb

## 2018-03-28 DIAGNOSIS — Z23 Encounter for immunization: Secondary | ICD-10-CM

## 2018-03-28 DIAGNOSIS — R7309 Other abnormal glucose: Secondary | ICD-10-CM | POA: Diagnosis not present

## 2018-03-28 DIAGNOSIS — Z0000 Encounter for general adult medical examination without abnormal findings: Secondary | ICD-10-CM

## 2018-03-28 DIAGNOSIS — Z1159 Encounter for screening for other viral diseases: Secondary | ICD-10-CM | POA: Diagnosis not present

## 2018-03-28 DIAGNOSIS — Z125 Encounter for screening for malignant neoplasm of prostate: Secondary | ICD-10-CM | POA: Diagnosis not present

## 2018-03-28 DIAGNOSIS — I10 Essential (primary) hypertension: Secondary | ICD-10-CM | POA: Diagnosis not present

## 2018-03-28 NOTE — Progress Notes (Signed)
Subjective:    CC:  CPE  HPI: 58 yo male is here for CPE.   He is doing well overall.  Not currently exercising he says he usually does not exercise much during the winter months.  No recent chest pain or shortness of breath.  He is due for lab work. Had coffee this AM.    BP 121/74   Pulse 73   Ht 5\' 11"  (1.803 m)   Wt 206 lb (93.4 kg)   SpO2 97%   BMI 28.73 kg/m     Not on File  Past Medical History:  Diagnosis Date  . Hyperlipidemia   . Hypertension   . Varicocele     Past Surgical History:  Procedure Laterality Date  . BACK SURGERY    . bilateral groin hernia    . left foot surgery   2018   bone spur   . radical mastoidectomy    . reconstructive ear surgery      Social History   Socioeconomic History  . Marital status: Married    Spouse name: Not on file  . Number of children: Not on file  . Years of education: Not on file  . Highest education level: Not on file  Occupational History  . Not on file  Social Needs  . Financial resource strain: Not on file  . Food insecurity:    Worry: Not on file    Inability: Not on file  . Transportation needs:    Medical: Not on file    Non-medical: Not on file  Tobacco Use  . Smoking status: Former Smoker    Last attempt to quit: 01/07/1981    Years since quitting: 37.2  . Smokeless tobacco: Former Engineer, waterUser  Substance and Sexual Activity  . Alcohol use: Yes    Comment: 8-10 drinks a week   . Drug use: No  . Sexual activity: Yes  Lifestyle  . Physical activity:    Days per week: Not on file    Minutes per session: Not on file  . Stress: Not on file  Relationships  . Social connections:    Talks on phone: Not on file    Gets together: Not on file    Attends religious service: Not on file    Active member of club or organization: Not on file    Attends meetings of clubs or organizations: Not on file    Relationship status: Not on file  . Intimate partner violence:    Fear of current or ex partner: Not on file     Emotionally abused: Not on file    Physically abused: Not on file    Forced sexual activity: Not on file  Other Topics Concern  . Not on file  Social History Narrative  . Not on file    Family History  Problem Relation Age of Onset  . Hypertension Mother   . Cancer - Other Father        biliary    Outpatient Encounter Medications as of 03/28/2018  Medication Sig  . finasteride (PROSCAR) 5 MG tablet Take 1 tablet (5 mg total) by mouth daily.  . lansoprazole (PREVACID) 30 MG capsule Take 30 mg by mouth daily at 12 noon.  Marland Kitchen. lisinopril (PRINIVIL,ZESTRIL) 20 MG tablet Take 1 tablet (20 mg total) by mouth daily.   No facility-administered encounter medications on file as of 03/28/2018.        Objective:    Physical Exam Constitutional:  Appearance: He is well-developed.  HENT:     Head: Normocephalic and atraumatic.     Right Ear: External ear normal.     Left Ear: External ear normal.     Nose: Nose normal.  Eyes:     Conjunctiva/sclera: Conjunctivae normal.     Pupils: Pupils are equal, round, and reactive to light.  Neck:     Musculoskeletal: Normal range of motion and neck supple.     Thyroid: No thyromegaly.  Cardiovascular:     Rate and Rhythm: Normal rate and regular rhythm.     Heart sounds: Normal heart sounds.  Pulmonary:     Effort: Pulmonary effort is normal.     Breath sounds: Normal breath sounds.  Abdominal:     General: Bowel sounds are normal. There is no distension.     Palpations: Abdomen is soft. There is no mass.     Tenderness: There is no abdominal tenderness. There is no guarding or rebound.  Musculoskeletal: Normal range of motion.  Lymphadenopathy:     Cervical: No cervical adenopathy.  Skin:    General: Skin is warm and dry.  Neurological:     Mental Status: He is alert and oriented to person, place, and time.     Deep Tendon Reflexes: Reflexes are normal and symmetric.  Psychiatric:        Behavior: Behavior normal.         Thought Content: Thought content normal.        Judgment: Judgment normal.      Impression and Recommendations:   Keep up a regular exercise program and make sure you are eating a healthy diet Try to eat 4 servings of dairy a day, or if you are lactose intolerant take a calcium with vitamin D daily.  Your vaccines are up to date.  Due for labs. Flu vaccine given today.  Due for screening labs.  Hep c screening ordered.   He did have a colonoscopy done with Dr. Matthias Hughs back in 2015 so we will call to get a copy of that report.

## 2018-03-28 NOTE — Patient Instructions (Addendum)

## 2018-03-29 LAB — COMPLETE METABOLIC PANEL WITH GFR
AG Ratio: 1.6 (calc) (ref 1.0–2.5)
ALT: 18 U/L (ref 9–46)
AST: 16 U/L (ref 10–35)
Albumin: 4.2 g/dL (ref 3.6–5.1)
Alkaline phosphatase (APISO): 86 U/L (ref 40–115)
BILIRUBIN TOTAL: 0.4 mg/dL (ref 0.2–1.2)
BUN: 18 mg/dL (ref 7–25)
CHLORIDE: 105 mmol/L (ref 98–110)
CO2: 31 mmol/L (ref 20–32)
Calcium: 9.4 mg/dL (ref 8.6–10.3)
Creat: 0.88 mg/dL (ref 0.70–1.33)
GFR, EST AFRICAN AMERICAN: 111 mL/min/{1.73_m2} (ref >=60)
GFR, Est Non African American: 95 mL/min/{1.73_m2} (ref >=60)
GLUCOSE: 98 mg/dL (ref 65–99)
Globulin: 2.6 g/dL (calc) (ref 1.9–3.7)
Potassium: 4.6 mmol/L (ref 3.5–5.3)
Sodium: 141 mmol/L (ref 135–146)
TOTAL PROTEIN: 6.8 g/dL (ref 6.1–8.1)

## 2018-03-29 LAB — HEMOGLOBIN A1C
EAG (MMOL/L): 6 (calc)
HEMOGLOBIN A1C: 5.4 %{Hb} (ref <5.7)
MEAN PLASMA GLUCOSE: 108 (calc)

## 2018-03-29 LAB — LIPID PANEL
Cholesterol: 224 mg/dL — ABNORMAL HIGH (ref <200)
HDL: 60 mg/dL (ref >40)
LDL CHOLESTEROL (CALC): 144 mg/dL — AB
Non-HDL Cholesterol (Calc): 164 mg/dL (calc) — ABNORMAL HIGH (ref <130)
TRIGLYCERIDES: 94 mg/dL (ref <150)
Total CHOL/HDL Ratio: 3.7 (calc) (ref <5.0)

## 2018-03-29 LAB — HEPATITIS C ANTIBODY
HEP C AB: NONREACTIVE
SIGNAL TO CUT-OFF: 0.01 (ref <1.00)

## 2018-03-29 LAB — PSA: PSA: 0.2 ng/mL (ref <=4.0)

## 2018-07-12 ENCOUNTER — Encounter: Payer: Self-pay | Admitting: Family Medicine

## 2018-07-12 DIAGNOSIS — F419 Anxiety disorder, unspecified: Secondary | ICD-10-CM

## 2018-07-18 ENCOUNTER — Encounter: Payer: Self-pay | Admitting: Family Medicine

## 2018-07-22 ENCOUNTER — Ambulatory Visit (INDEPENDENT_AMBULATORY_CARE_PROVIDER_SITE_OTHER): Payer: BLUE CROSS/BLUE SHIELD | Admitting: Professional

## 2018-07-22 DIAGNOSIS — F4323 Adjustment disorder with mixed anxiety and depressed mood: Secondary | ICD-10-CM

## 2018-07-27 ENCOUNTER — Ambulatory Visit (INDEPENDENT_AMBULATORY_CARE_PROVIDER_SITE_OTHER): Payer: BLUE CROSS/BLUE SHIELD | Admitting: Professional

## 2018-07-27 DIAGNOSIS — F4323 Adjustment disorder with mixed anxiety and depressed mood: Secondary | ICD-10-CM

## 2018-08-01 ENCOUNTER — Ambulatory Visit (INDEPENDENT_AMBULATORY_CARE_PROVIDER_SITE_OTHER): Payer: BLUE CROSS/BLUE SHIELD | Admitting: Professional

## 2018-08-01 DIAGNOSIS — F4323 Adjustment disorder with mixed anxiety and depressed mood: Secondary | ICD-10-CM

## 2018-08-02 ENCOUNTER — Telehealth (INDEPENDENT_AMBULATORY_CARE_PROVIDER_SITE_OTHER): Payer: BC Managed Care – PPO | Admitting: Family Medicine

## 2018-08-02 ENCOUNTER — Encounter: Payer: Self-pay | Admitting: Family Medicine

## 2018-08-02 VITALS — Ht 71.0 in

## 2018-08-02 DIAGNOSIS — H5789 Other specified disorders of eye and adnexa: Secondary | ICD-10-CM | POA: Diagnosis not present

## 2018-08-02 DIAGNOSIS — R21 Rash and other nonspecific skin eruption: Secondary | ICD-10-CM | POA: Diagnosis not present

## 2018-08-02 NOTE — Progress Notes (Signed)
Virtual Visit via Video Note  I connected with Ethan Bennett on 08/02/18 at  1:20 PM EDT by a video enabled telemedicine application and verified that I am speaking with the correct person using two identifiers.   I discussed the limitations of evaluation and management by telemedicine and the availability of in person appointments. The patient expressed understanding and agreed to proceed.  Pt was at home and I was in my office for the virtual visit.     Subjective:    CC:   HPI:  He has a history of burning eyes and headaches.  He says it sometimes feels like there is something in his eyes particularly in the corners.  Says the white of his eyes are red. Occ sharp shooting pain in his eyes.  There is a thought initially that the symptoms were more firm allergies.  So he started using some Visine allergy over-the-counter it does seem to help when he uses it.  He says he had an eye exam about 3 or 4 months ago and the eye doctor at the time thought it was possibly dry eye and so gave him some prescription eyedrops to help with that.  He just does not feel like it really helped and it did not get rid of the redness in his eyes.  He was doing some research and talking to some family members and wonders if he could have herpes simplex of the eye.  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  He is also had some dry patches on his skin mostly around his neck which he thinks might be a fungus.  He is been treated for this before with some type of shampoo.  He is just at its about 58 years old.  He denies any significant irritation with the skin lesions.   Past medical history, Surgical history, Family history not pertinant except as noted below, Social history, Allergies, and medications have been entered into the medical record, reviewed, and corrections made.   Review of Systems: No fevers, chills, night  sweats, weight loss, chest pain, or shortness of breath.   Objective:    General: Speaking clearly in complete sentences without any shortness of breath.  Alert and oriented x3.  Normal judgment. No apparent acute distress.    Impression and Recommendations:   Burning/red eyes-based on his description and the persistence of his symptoms it sounds more consistent with dry eye but sounds like it did not really improve with the treatments that his ophthalmologist gave him.  Certainly there could be some allergic component and if he wanted to try Pataday over-the-counter for the next month just to see if it is helpful he could certainly try that.  I explained that I really do not think it is herpes as it does not sound like it is very cyclic it sounds like his symptoms have been pretty persistent versus having an outbreak for 1 to 2 weeks and then getting resolution in between outbreaks.  Plus that is typically diagnosed by an ophthalmologist under close examination and he would need to follow-up with them to have that better investigated.    Rash-sounds most consistent with tinea versicolor versus some form of eczema.  We discussed that we could use a topical ketoconazole shampoo or cream.  But he would prefer to make an office visit sometime next week so  that we can actually take a look at it and possibly do a skin scraping.   I discussed the assessment and treatment plan with the patient. The patient was provided an opportunity to ask questions and all were answered. The patient agreed with the plan and demonstrated an understanding of the instructions.   The patient was advised to call back or seek an in-person evaluation if the symptoms worsen or if the condition fails to improve as anticipated.   Ethan Gasseratherine Quest Tavenner, MD

## 2018-08-02 NOTE — Progress Notes (Signed)
lvm for pt advising pt that I was calling to do his prescreening prior to his virtual visit w/Dr. Linford Arnold.Marland KitchenMarland KitchenLoralee Pacas [place], CMA Called pt again and left another vm advising him that this was my 2nd attempt to call (114 pm) and that his appt w/Dr. Linford Arnold would be starting soon and he should be ready for her shortly.Loralee Pacas [place]

## 2018-08-08 ENCOUNTER — Encounter: Payer: Self-pay | Admitting: Family Medicine

## 2018-08-08 ENCOUNTER — Ambulatory Visit (INDEPENDENT_AMBULATORY_CARE_PROVIDER_SITE_OTHER): Payer: BC Managed Care – PPO | Admitting: Family Medicine

## 2018-08-08 ENCOUNTER — Ambulatory Visit (INDEPENDENT_AMBULATORY_CARE_PROVIDER_SITE_OTHER): Payer: BC Managed Care – PPO | Admitting: Professional

## 2018-08-08 VITALS — BP 130/77 | HR 75 | Ht 71.0 in | Wt 205.0 lb

## 2018-08-08 DIAGNOSIS — F4323 Adjustment disorder with mixed anxiety and depressed mood: Secondary | ICD-10-CM | POA: Diagnosis not present

## 2018-08-08 DIAGNOSIS — H9192 Unspecified hearing loss, left ear: Secondary | ICD-10-CM

## 2018-08-08 DIAGNOSIS — B360 Pityriasis versicolor: Secondary | ICD-10-CM | POA: Diagnosis not present

## 2018-08-08 DIAGNOSIS — R21 Rash and other nonspecific skin eruption: Secondary | ICD-10-CM

## 2018-08-08 DIAGNOSIS — H10403 Unspecified chronic conjunctivitis, bilateral: Secondary | ICD-10-CM | POA: Diagnosis not present

## 2018-08-08 NOTE — Progress Notes (Addendum)
Established Patient Office Visit  Subjective:  Patient ID: Ethan Bennett, male    DOB: 11-15-1960  Age: 58 y.o. MRN: [id number]  CC:  Chief Complaint  Patient presents with  . Rash    HPI Ethan SignsDaniel J Charrier presents for rash.   He is also had some dry patches on his skin mostly around his neck which he thinks might be a fungus.  He is been treated for this before with some type of shampoo.  He is just at its about 58 years old.  He denies any significant irritation with the skin lesions.  Continues to have red irritated eyes.  They are watery and burning.  Not currently using any specific drops.  But did try something that was given to him by his eye doctor last fall.  Really seem to help.  Past Medical History:  Diagnosis Date  . Hyperlipidemia   . Hypertension   . Varicocele     Past Surgical History:  Procedure Laterality Date  . BACK SURGERY    . bilateral groin hernia    . left foot surgery   2018   bone spur   . radical mastoidectomy    . reconstructive ear surgery      Family History  Problem Relation Age of Onset  . Hypertension Mother   . Cancer - Other Father        biliary    Social History   Socioeconomic History  . Marital status: Married    Spouse name: Not on file  . Number of children: Not on file  . Years of education: Not on file  . Highest education level: Not on file  Occupational History  . Not on file  Social Needs  . Financial resource strain: Not on file  . Food insecurity:    Worry: Not on file    Inability: Not on file  . Transportation needs:    Medical: Not on file    Non-medical: Not on file  Tobacco Use  . Smoking status: Former Smoker    Last attempt to quit: 01/07/1981    Years since quitting: 37.6  . Smokeless tobacco: Former Engineer, waterUser  Substance and Sexual Activity  . Alcohol use: Yes    Comment: 8-10 drinks a week   . Drug use: No  . Sexual activity: Yes  Lifestyle  . Physical activity:    Days per week: Not on  file    Minutes per session: Not on file  . Stress: Not on file  Relationships  . Social connections:    Talks on phone: Not on file    Gets together: Not on file    Attends religious service: Not on file    Active member of club or organization: Not on file    Attends meetings of clubs or organizations: Not on file    Relationship status: Not on file  . Intimate partner violence:    Fear of current or ex partner: Not on file    Emotionally abused: Not on file    Physically abused: Not on file    Forced sexual activity: Not on file  Other Topics Concern  . Not on file  Social History Narrative  . Not on file    Outpatient Medications Prior to Visit  Medication Sig Dispense Refill  . finasteride (PROSCAR) 5 MG tablet Take 1 tablet (5 mg total) by mouth daily. 90 tablet 1  . lansoprazole (PREVACID) 30 MG capsule Take 30 mg by  mouth daily at 12 noon.    Marland Kitchen lisinopril (PRINIVIL,ZESTRIL) 20 MG tablet Take 1 tablet (20 mg total) by mouth daily. 90 tablet 1   No facility-administered medications prior to visit.     No Known Allergies  ROS Review of Systems    Objective:    Physical Exam  Constitutional: He is oriented to person, place, and time. He appears well-developed and well-nourished.  HENT:  Head: Normocephalic and atraumatic.  Right Ear: External ear normal.  Left Ear: External ear normal.  Nose: Nose normal.  Mouth/Throat: Oropharynx is clear and moist.  Scar on the leftt TM, clear on the left.   Eyes: Pupils are equal, round, and reactive to light. Conjunctivae and EOM are normal.  Conjunctiva are injected and lower lids are mildly swollen.   Neck: Neck supple. No thyromegaly present.  Cardiovascular: Normal rate and normal heart sounds.  Pulmonary/Chest: Effort normal and breath sounds normal.  Lymphadenopathy:    He has no cervical adenopathy.  Neurological: He is alert and oriented to person, place, and time.  Skin: Skin is warm and dry.   Erythematous/pink circular macular lesion on her anterior neck  Psychiatric: He has a normal mood and affect.    BP 130/77   Pulse 75   Ht 5\' 11"  (1.803 m)   Wt 205 lb (93 kg)   SpO2 97%   BMI 28.59 kg/m  Wt Readings from Last 3 Encounters:  08/08/18 205 lb (93 kg)  03/28/18 206 lb (93.4 kg)  02/16/18 202 lb (91.6 kg)     Health Maintenance Due  Topic Date Due  . HIV Screening  07/20/1975  . COLONOSCOPY  07/20/2010    There are no preventive care reminders to display for this patient.  No results found for: TSH Lab Results  Component Value Date   WBC 5.2 01/12/2017   HGB 14.8 01/12/2017   HCT 43.1 01/12/2017   MCV 90.9 01/12/2017   PLT 231 01/12/2017   Lab Results  Component Value Date   NA 141 03/28/2018   K 4.6 03/28/2018   CO2 31 03/28/2018   GLUCOSE 98 03/28/2018   BUN 18 03/28/2018   CREATININE 0.88 03/28/2018   BILITOT 0.4 03/28/2018   ALKPHOS 66 06/12/2015   AST 16 03/28/2018   ALT 18 03/28/2018   PROT 6.8 03/28/2018   CALCIUM 9.4 03/28/2018   Lab Results  Component Value Date   CHOL 224 (H) 03/28/2018   Lab Results  Component Value Date   HDL 60 03/28/2018   Lab Results  Component Value Date   LDLCALC 144 (H) 03/28/2018   Lab Results  Component Value Date   TRIG 94 03/28/2018   Lab Results  Component Value Date   CHOLHDL 3.7 03/28/2018   Lab Results  Component Value Date   HGBA1C 5.4 03/28/2018      Assessment & Plan:   Problem List Items Addressed This Visit      Nervous and Auditory   Hearing loss    Other Visit Diagnoses    Rash    -  Primary   Relevant Orders   Fungal stain   Tinea versicolor       Relevant Orders   Fungal stain   Chronic conjunctivitis of both eyes, unspecified chronic conjunctivitis type         RASH - mos consistant with tinea versicolor.  KOH performed.  Call results once available.  Conjunctivitis-recommend a trial of Pataday over-the-counter.  If not improving then  recommend that we get  him in with ophthalmology.  Ability just previously saw optometrist.  Also consider dry eye.   No orders of the defined types were placed in this encounter.   Follow-up: No follow-ups on file.    Nani Gasseratherine Olumide Dolinger, MD

## 2018-08-09 ENCOUNTER — Encounter: Payer: Self-pay | Admitting: Family Medicine

## 2018-08-09 ENCOUNTER — Other Ambulatory Visit: Payer: Self-pay | Admitting: Family Medicine

## 2018-08-09 DIAGNOSIS — H9190 Unspecified hearing loss, unspecified ear: Secondary | ICD-10-CM | POA: Insufficient documentation

## 2018-08-09 MED ORDER — KETOCONAZOLE 2 % EX CREA: 1.0000 "application " | TOPICAL_CREAM | Freq: Two times a day (BID) | CUTANEOUS | 0 refills | Status: DC

## 2018-08-10 LAB — FUNGAL STAIN
MICRO NUMBER:: 546757
SPECIMEN QUALITY:: ADEQUATE

## 2018-08-21 ENCOUNTER — Other Ambulatory Visit: Payer: Self-pay | Admitting: Family Medicine

## 2018-08-22 ENCOUNTER — Ambulatory Visit (INDEPENDENT_AMBULATORY_CARE_PROVIDER_SITE_OTHER): Payer: BC Managed Care – PPO | Admitting: Professional

## 2018-08-22 DIAGNOSIS — F4323 Adjustment disorder with mixed anxiety and depressed mood: Secondary | ICD-10-CM

## 2018-08-24 ENCOUNTER — Encounter: Payer: Self-pay | Admitting: Family Medicine

## 2018-08-25 MED ORDER — VALACYCLOVIR HCL 1 G PO TABS: 1000.0000 mg | ORAL_TABLET | Freq: Two times a day (BID) | ORAL | 1 refills | Status: DC | PRN

## 2018-08-31 ENCOUNTER — Encounter: Payer: Self-pay | Admitting: Family Medicine

## 2018-08-31 MED ORDER — LANSOPRAZOLE 30 MG PO CPDR: 30.0000 mg | DELAYED_RELEASE_CAPSULE | Freq: Every day | ORAL | 3 refills | Status: DC

## 2018-09-05 ENCOUNTER — Ambulatory Visit: Payer: BC Managed Care – PPO | Admitting: Professional

## 2018-09-19 ENCOUNTER — Ambulatory Visit (INDEPENDENT_AMBULATORY_CARE_PROVIDER_SITE_OTHER): Payer: BC Managed Care – PPO | Admitting: Professional

## 2018-09-19 DIAGNOSIS — F4323 Adjustment disorder with mixed anxiety and depressed mood: Secondary | ICD-10-CM | POA: Diagnosis not present

## 2018-10-18 ENCOUNTER — Ambulatory Visit (INDEPENDENT_AMBULATORY_CARE_PROVIDER_SITE_OTHER): Payer: BC Managed Care – PPO | Admitting: Professional

## 2018-10-18 DIAGNOSIS — F4323 Adjustment disorder with mixed anxiety and depressed mood: Secondary | ICD-10-CM

## 2018-12-14 DIAGNOSIS — Z20828 Contact with and (suspected) exposure to other viral communicable diseases: Secondary | ICD-10-CM | POA: Diagnosis not present

## 2018-12-15 ENCOUNTER — Encounter: Payer: Self-pay | Admitting: Family Medicine

## 2019-01-05 ENCOUNTER — Encounter: Payer: Self-pay | Admitting: Family Medicine

## 2019-01-05 NOTE — Telephone Encounter (Signed)
RX pended, please advise 

## 2019-01-06 MED ORDER — BETAMETHASONE DIPROPIONATE AUG 0.05 % EX LOTN: TOPICAL_LOTION | CUTANEOUS | 0 refills | Status: DC

## 2019-02-06 DIAGNOSIS — H43813 Vitreous degeneration, bilateral: Secondary | ICD-10-CM | POA: Diagnosis not present

## 2019-02-17 ENCOUNTER — Other Ambulatory Visit: Payer: Self-pay | Admitting: Family Medicine

## 2019-02-17 NOTE — Telephone Encounter (Signed)
MUST MAKE APPOINTMENT 

## 2019-05-17 ENCOUNTER — Other Ambulatory Visit: Payer: Self-pay | Admitting: *Deleted

## 2019-05-17 MED ORDER — LISINOPRIL 20 MG PO TABS: 20.0000 mg | ORAL_TABLET | Freq: Every day | ORAL | 0 refills | Status: DC

## 2019-06-07 ENCOUNTER — Encounter: Payer: Self-pay | Admitting: Family Medicine

## 2019-06-08 ENCOUNTER — Telehealth (INDEPENDENT_AMBULATORY_CARE_PROVIDER_SITE_OTHER): Payer: BC Managed Care – PPO | Admitting: Medical-Surgical

## 2019-06-08 ENCOUNTER — Encounter: Payer: Self-pay | Admitting: Medical-Surgical

## 2019-06-08 DIAGNOSIS — I10 Essential (primary) hypertension: Secondary | ICD-10-CM

## 2019-06-08 DIAGNOSIS — J309 Allergic rhinitis, unspecified: Secondary | ICD-10-CM

## 2019-06-08 MED ORDER — AZELASTINE HCL 0.1 % NA SOLN: 2.0000 | Freq: Two times a day (BID) | NASAL | 2 refills | Status: DC

## 2019-06-08 MED ORDER — LISINOPRIL 20 MG PO TABS: 20.0000 mg | ORAL_TABLET | Freq: Every day | ORAL | 1 refills | Status: DC

## 2019-06-08 NOTE — Progress Notes (Signed)
Called patient and left VM at 11:52 am to get further information. Will try again closer to time.

## 2019-06-08 NOTE — Progress Notes (Signed)
Virtual Visit via Video Note  I connected with Ethan Bennett on 06/08/19 at  1:00 PM EDT by a video enabled telemedicine application and verified that I am speaking with the correct person using two identifiers.   I discussed the limitations of evaluation and management by telemedicine and the availability of in person appointments. The patient expressed understanding and agreed to proceed.  Subjective:    CC: HTN, rhinitis  HPI: 59 year old male presenting via MyChart video visit to discuss hypertension and rhinitis exacerbation.  HTN-taking lisinopril 20 mg daily, no side effects, tolerating well.  Does not check blood pressures regularly at home.  Reports his normal blood pressures are usually in the 110s/70-80.  Denies chest pain, shortness of breath, lower extremity edema, palpitations, headaches, or dizziness.  Rhinitis exacerbation-sees an allergist and has been tested several times over the years.  Currently taking Zyrtec and Flonase which helps with symptoms some but is not enough to control his current symptom exacerbation.  Reports his eyes are bloodshot with increased sneezing, headache, and nasal congestion.  Would like to have a prescription for azelastine nasal spray.  Used his brothers prescription and was very impressed with how much this helped his symptoms.  Past medical history, Surgical history, Family history not pertinant except as noted below, Social history, Allergies, and medications have been entered into the medical record, reviewed, and corrections made.   Review of Systems: No fevers, chills, night sweats, weight loss, chest pain, or shortness of breath.   Objective:    General: Speaking clearly in complete sentences without any shortness of breath.  Alert and oriented x3.  Normal judgment. No apparent acute distress.  Impression and Recommendations:    1. Allergic rhinitis, unspecified seasonality, unspecified trigger .  Starting azelastine nasal spray  2 sprays each nostril twice daily.  Continue Flonase and Zyrtec for optimal symptom management. - azelastine (ASTELIN) 0.1 % nasal spray; Place 2 sprays into both nostrils 2 (two) times daily. Use in each nostril as directed  Dispense: 30 mL; Refill: 2  2. Essential hypertension Continue lisinopril 20 mg daily.  Greater than 1 year since last lab work drawn so we will check CBC, CMP, lipid panel.  Patient to come by 1 morning before work to have labs drawn. - CBC - COMPLETE METABOLIC PANEL WITH GFR - Lipid panel - lisinopril (ZESTRIL) 20 MG tablet; Take 1 tablet (20 mg total) by mouth daily.  Dispense: 90 tablet; Refill: 1  I discussed the assessment and treatment plan with the patient. The patient was provided an opportunity to ask questions and all were answered. The patient agreed with the plan and demonstrated an understanding of the instructions.   The patient was advised to call back or seek an in-person evaluation if the symptoms worsen or if the condition fails to improve as anticipated.  Return in about 6 months (around 12/08/2019) for HTN, GERD.  20 minutes of non-face-to-face time was provided during this encounter.  Thayer Ohm, DNP, APRN, FNP-BC [facility]

## 2019-06-20 DIAGNOSIS — M25521 Pain in right elbow: Secondary | ICD-10-CM | POA: Diagnosis not present

## 2019-06-20 DIAGNOSIS — G5621 Lesion of ulnar nerve, right upper limb: Secondary | ICD-10-CM | POA: Diagnosis not present

## 2019-06-20 DIAGNOSIS — M7701 Medial epicondylitis, right elbow: Secondary | ICD-10-CM | POA: Diagnosis not present

## 2019-06-26 ENCOUNTER — Other Ambulatory Visit: Payer: Self-pay | Admitting: *Deleted

## 2019-06-26 ENCOUNTER — Encounter: Payer: Self-pay | Admitting: Family Medicine

## 2019-06-26 MED ORDER — KETOCONAZOLE 2 % EX CREA: 1.0000 "application " | TOPICAL_CREAM | Freq: Two times a day (BID) | CUTANEOUS | 1 refills | Status: DC

## 2019-07-05 ENCOUNTER — Other Ambulatory Visit: Payer: Self-pay

## 2019-07-05 ENCOUNTER — Ambulatory Visit (INDEPENDENT_AMBULATORY_CARE_PROVIDER_SITE_OTHER): Payer: BC Managed Care – PPO | Admitting: Rehabilitative and Restorative Service Providers"

## 2019-07-05 DIAGNOSIS — M25521 Pain in right elbow: Secondary | ICD-10-CM

## 2019-07-05 DIAGNOSIS — R293 Abnormal posture: Secondary | ICD-10-CM | POA: Diagnosis not present

## 2019-07-05 DIAGNOSIS — M6281 Muscle weakness (generalized): Secondary | ICD-10-CM

## 2019-07-05 NOTE — Therapy (Signed)
[place], [place], [address] Phone: [phone]   Fax:  [phone]  Physical Therapy Evaluation  Patient Details  Name: Ethan Bennett MRN: [id number] Date of Birth: August 29, 1960 Referring Provider (PT): Harlin Heys, MD   Encounter Date: 07/05/2019  PT End of Session - 07/05/19 2049    Visit Number  1    Number of Visits  12    Date for PT Re-Evaluation  08/16/19    PT Start Time  0845    PT Stop Time  0928    PT Time Calculation (min)  43 min       Past Medical History:  Diagnosis Date  . Hyperlipidemia   . Hypertension   . Varicocele     Past Surgical History:  Procedure Laterality Date  . BACK SURGERY    . bilateral groin hernia    . left foot surgery   2018   bone spur   . radical mastoidectomy    . reconstructive ear surgery      There were no vitals filed for this visit.   Subjective Assessment - 07/05/19 0854    Subjective  The patient had an insidious onset of R elbow pain approximately one year ago.  Pain is aggravated by using tools (repetitive motion of turning a screw driver) and lifting heavy items.  The prednisone helped, but it didn't get rid of pain.  He is also using OTC voltaren gel.  He feels symptoms improved approximately 30% with medications.    Patient Stated Goals  reduce pain    Currently in Pain?  Yes    Pain Score  2    goes up to 8/10 at worst with activities   Pain Location  Elbow    Pain Orientation  Right    Pain Descriptors / Indicators  Sharp    Pain Type  Chronic pain    Pain Onset  More than a month ago    Pain Frequency  Intermittent    Aggravating Factors   repetitive use, lifting heavy items    Pain Relieving Factors  voltaren gel, prednisone reduced pain         [facility] PT Assessment - 07/05/19 0901      Assessment   Medical Diagnosis  R medial epicondylitis and cubital tunnel syndrome    Referring Provider (PT)  Harlin Heys, MD    Onset Date/Surgical Date  06/27/19    Hand Dominance  Right    Prior Therapy  none      Balance Screen   Has the patient fallen in the past 6 months  No    Has the patient had a decrease in activity level because of a fear of falling?   No    Is the patient reluctant to leave their home because of a fear of falling?   No      Home Film/video editor residence      Prior Function   Level of Independence  Independent    Vocation Requirements  recruiter, sitting at a desk with computer    Leisure  household items      Observation/Other Assessments   Focus on Therapeutic Outcomes (FOTO)   50% limitation      Sensation   Light Touch  Appears Intact      Posture/Postural Control   Posture/Postural Control  Postural limitations    Posture Comments  Rounded shoulder position noted.  ROM / Strength   AROM / PROM / Strength  AROM;Strength      AROM   Overall AROM   Within functional limits for tasks performed    Overall AROM Comments  The patient has full AROM of the elbow.  He has pain in the R shoulder with end range flexion and ER    AROM Assessment Site  --    Right/Left Elbow  --      Strength   Overall Strength  Deficits    Overall Strength Comments  Right shoulder flexion 4/5 with pain, shoulder abduction 4+/5,  elbow flexion/extension 5/5, wrist flexion is 5/5, wrist extension is 5/5 with some pain noted, pain with with supination and MMT 4/5 supination and mild pain with pronation.        Palpation   Palpation comment  Pain with palpation of cubital tunnel, pain with palpation of the medial elbow,  pain with ulnar nerve flossing palms together med/lateral movement.      Special Tests   Other special tests  positive tinel's sign                Objective measurements completed on examination: See above findings.      [facility] Adult PT Treatment/Exercise - 07/05/19 2046      Self-Care   Self-Care  Other Self-Care  Comments    Other Self-Care Comments   education on ice massage provided and handout for iontophoresis.      Exercises   Exercises  Elbow;Shoulder      Elbow Exercises   Other elbow exercises  Ulnar nerve glides with prayer position moving UEs R<>L, and elbow/shoulder flexion to elbow extension with pronation.        Shoulder Exercises: Lawyer  2 reps    Research officer, political party Limitations  doorway stretch with shoulders at 90 degrees and 120 degrees.  Pain in R shoulder with 120 degrees-- began with 90 degrees for HEP.               PT Education - 07/05/19 2045    Education Details  HEP initiated    Person(s) Educated  Patient    Methods  Explanation;Demonstration;Handout    Comprehension  Returned demonstration;Verbalized understanding          PT Long Term Goals - 07/05/19 2049      PT LONG TERM GOAL #1   Title  The patient will be indep with HEP for R shoulder, elbow, and wrist flexibility and strengthening.    Time  6    Period  Weeks    Target Date  08/16/19      PT LONG TERM GOAL #2   Title  The patient will reduce functional limitation from 50% to < or equal to 36% per FOTO.    Time  6    Period  Weeks    Target Date  08/16/19      PT LONG TERM GOAL #3   Title  The patient will be able to lift 6bs to overhead shelf using R UE without elbow pain.    Time  6    Period  Weeks    Target Date  08/16/19      PT LONG TERM GOAL #4   Title  The patient will improve R shoulder strength to 5/5.    Time  6    Period  Weeks    Target Date  08/16/19      PT LONG TERM  GOAL #5   Title  The patient will report pain < or equal to 4/10 at worst.    Time  6    Period  Weeks    Target Date  08/16/19             Plan - 07/05/19 2052    Clinical Impression Statement  The patient is a 59 yo male presenting to outpatient physical therapy with chronic R medial epicondylitis and cubital tunnel syndrome.  He presents with impairments in R shoulder  strength, R shoulder end range AROM, postural shortening, pain in R cubital tunnel, pain with repetitive R UE movements and lifting.  He has functional limitations of dec'd lifting with R UE and dec'd use of R UE for household tasks.  In addition to elbow pain, the patient has h/o R shoulder deficits, which may be contributing to his clinical presentation.  PT to address deficits to optimize R UE strength for daily activities.    Examination-Activity Limitations  Lift    Stability/Clinical Decision Making  Stable/Uncomplicated    Clinical Decision Making  Low    Rehab Potential  Good    PT Frequency  2x / week    PT Duration  6 weeks    PT Treatment/Interventions  ADLs/Self Care Home Management;Iontophoresis 4mg /ml Dexamethasone;Cryotherapy;Electrical Stimulation;Moist Heat;Ultrasound;Therapeutic exercise;Therapeutic activities;Taping;Dry needling;Manual techniques;Patient/family education    PT Next Visit Plan  Progress HEP, add strengthening for shoulder, wrist and elbow flexion/extension.  Plan for iontophoresis next visit (had voltaren gel on), Also may benefit from dry needling (to discuss at subsequent visits).    PT Home Exercise Plan  Access Code:    Consulted and Agree with Plan of Care  Patient       Patient will benefit from skilled therapeutic intervention in order to improve the following deficits and impairments:  Pain, Decreased strength, Decreased range of motion, Postural dysfunction, Impaired flexibility  Visit Diagnosis: Pain in right elbow  Muscle weakness (generalized)  Abnormal posture     Problem List Patient Active Problem List   Diagnosis Date Noted  . Hearing loss 08/09/2018  . 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 Gastroesophageal reflux disease with esophagitis 01/07/2017  . Oral lesion 01/07/2017  . Essential hypertension 01/04/2010    Jkwon Treptow, PT 07/05/2019, 8:58 PM  [facility] [address] [place], [place], [place] Phone: [phone]   Fax:  [phone]  Name: Ethan Bennett MRN: Aviva Signs Date of Birth: 1960-03-31

## 2019-07-05 NOTE — Patient Instructions (Signed)
Access Code: [email or web address] URL: [email or web address] Date: 07/05/2019 Prepared by: Margretta Ditty  Exercises Ulnar Nerve Flossing - 2 x daily - 7 x weekly - 10 reps - 1 sets Ulnar Nerve Mobilization - Low Level - 2 x daily - 7 x weekly - 1 sets - 10 reps Doorway Pec Stretch at 90 Degrees Abduction - 2 x daily - 7 x weekly - 1 sets - 3 reps - 20 seconds hold Standing Pec Stretch at Wall - 2 x daily - 7 x weekly - 10 reps - 1 sets  Patient Education Ice Massage [place] Patient Instructions

## 2019-07-11 ENCOUNTER — Other Ambulatory Visit: Payer: Self-pay

## 2019-07-11 ENCOUNTER — Encounter: Payer: Self-pay | Admitting: Physical Therapy

## 2019-07-11 ENCOUNTER — Ambulatory Visit: Payer: BC Managed Care – PPO | Admitting: Physical Therapy

## 2019-07-11 DIAGNOSIS — M25521 Pain in right elbow: Secondary | ICD-10-CM

## 2019-07-11 DIAGNOSIS — M6281 Muscle weakness (generalized): Secondary | ICD-10-CM

## 2019-07-11 DIAGNOSIS — R293 Abnormal posture: Secondary | ICD-10-CM

## 2019-07-11 NOTE — Therapy (Signed)
[place], [place], [address] Phone: [phone]   Fax:  [phone]  Physical Therapy Treatment  Patient Details  Name: Ethan Bennett MRN: [id number] Date of Birth: [birth year] Referring Provider (PT): Harlin Heys, MD   Encounter Date: 07/11/2019  PT End of Session - 07/11/19 0805    Visit Number  2    Number of Visits  12    Date for PT Re-Evaluation  08/16/19    PT Start Time  0805    PT Stop Time  0845    PT Time Calculation (min)  40 min    Activity Tolerance  Patient tolerated treatment well    Behavior During Therapy  [facility] for tasks assessed/performed       Past Medical History:  Diagnosis Date  . Hyperlipidemia   . Hypertension   . Varicocele     Past Surgical History:  Procedure Laterality Date  . BACK SURGERY    . bilateral groin hernia    . left foot surgery   2018   bone spur   . radical mastoidectomy    . reconstructive ear surgery      There were no vitals filed for this visit.  Subjective Assessment - 07/11/19 0805    Subjective  Pt reports he is doing his HEP, has some soreness from the new stretches    Currently in Pain?  No/denies    Pain Orientation  Right    Pain Descriptors / Indicators  Sharp;Aching    Pain Type  Chronic pain                       [facility] Adult PT Treatment/Exercise - 07/11/19 0001      Shoulder Exercises: Supine   Horizontal ABduction  Strengthening;Both;Theraband   3x10   Theraband Level (Shoulder Horizontal ABduction)  Level 3 (Green)    External Rotation  Strengthening;Both;Theraband   3x10   Theraband Level (Shoulder External Rotation)  Level 3 (Green)    Other Supine Exercises  attempted overhead pull with band - to much pain in Rt shoulder so stopped    Other Supine Exercises  3x10 row with green band, VC for form      Shoulder Exercises: Sidelying   Other Sidelying Exercises  open book with green band  3x10      Shoulder Exercises: ROM/Strengthening   UBE (Upper Arm Bike)  L1x4' alt FWD/BWD      Modalities   Modalities  Iontophoresis      Iontophoresis   Type of Iontophoresis  Dexamethasone    Location  medial elbow    Dose  1.0cc    Time  15mAPm patch      Manual Therapy   Manual Therapy  Soft tissue mobilization    Soft tissue mobilization  cross friction massage and TPR to Rt forearm to decrease tightness             PT Education - 07/11/19 0815    Education Details  HEP progression with band    Person(s) Educated  Patient    Methods  Explanation;Demonstration;Handout;Verbal cues    Comprehension  Returned demonstration;Verbalized understanding          PT Long Term Goals - 07/05/19 2049      PT LONG TERM GOAL #1   Title  The patient will be indep with HEP for R shoulder, elbow, and wrist flexibility and strengthening.    Time  6    Period  Weeks    Target Date  08/16/19      PT LONG TERM GOAL #2   Title  The patient will reduce functional limitation from 50% to < or equal to 36% per FOTO.    Time  6    Period  Weeks    Target Date  08/16/19      PT LONG TERM GOAL #3   Title  The patient will be able to lift 6bs to overhead shelf using R UE without elbow pain.    Time  6    Period  Weeks    Target Date  08/16/19      PT LONG TERM GOAL #4   Title  The patient will improve R shoulder strength to 5/5.    Time  6    Period  Weeks    Target Date  08/16/19      PT LONG TERM GOAL #5   Title  The patient will report pain < or equal to 4/10 at worst.    Time  6    Period  Weeks    Target Date  08/16/19            Plan - 07/11/19 0846    Clinical Impression Statement  Thisis Garner Nash first visit since eval.  Started ionto today.  He had decreased palpable tightness in the muscles forearm after manual work.  Tight through the pecs and weakness in the RTC.  He does have pain with overhead motions of the RT shoulder so this motion was avoided.     Rehab Potential  Good    PT Frequency  2x / week    PT Duration  6 weeks    PT Treatment/Interventions  ADLs/Self Care Home Management;Iontophoresis 4mg /ml Dexamethasone;Cryotherapy;Electrical Stimulation;Moist Heat;Ultrasound;Therapeutic exercise;Therapeutic activities;Taping;Dry needling;Manual techniques;Patient/family education    PT Next Visit Plan  assess response to ionto, cont manual work and RTC  with Plan of Care  Patient       Patient will benefit from skilled therapeutic intervention in order to improve the following deficits and impairments:  Pain, Decreased strength, Decreased range of motion, Postural dysfunction, Impaired flexibility  Visit Diagnosis: Pain in right elbow  Muscle weakness (generalized)  Abnormal posture     Problem List Patient Active Problem List   Diagnosis Date Noted  . Hearing loss 08/09/2018  . 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 Gastroesophageal reflux disease with esophagitis 01/07/2017  . Oral lesion 01/07/2017  . Essential hypertension 01/04/2010    13/06/2009 PT  07/11/2019, 8:49 AM  [facility] [address] [place] [address] [place], [place], [place] Phone: [phone]   Fax:  [phone]  Name: Ethan Bennett MRN: Aviva Signs Date of Birth: 1960/12/26

## 2019-07-13 ENCOUNTER — Other Ambulatory Visit: Payer: Self-pay

## 2019-07-13 ENCOUNTER — Ambulatory Visit (INDEPENDENT_AMBULATORY_CARE_PROVIDER_SITE_OTHER): Payer: BC Managed Care – PPO | Admitting: Physical Therapy

## 2019-07-13 DIAGNOSIS — R293 Abnormal posture: Secondary | ICD-10-CM

## 2019-07-13 DIAGNOSIS — M25521 Pain in right elbow: Secondary | ICD-10-CM

## 2019-07-13 DIAGNOSIS — M6281 Muscle weakness (generalized): Secondary | ICD-10-CM

## 2019-07-13 NOTE — Therapy (Signed)
[facility] [place] [address] [place] [address] [place], [place], [address] Phone: [phone]   Fax:  [phone]  Physical Therapy Treatment  Patient Details  Name: Ethan Bennett MRN: [id number] Date of Birth: Jul 06, 1960 Referring Provider (PT): Candi Leash, MD   Encounter Date: 07/13/2019  PT End of Session - 07/13/19 1202    Visit Number  3    Number of Visits  12    Date for PT Re-Evaluation  08/16/19    PT Start Time  1155    PT Stop Time  1237    PT Time Calculation (min)  42 min    Activity Tolerance  Patient tolerated treatment well    Behavior During Therapy  [facility] for tasks assessed/performed       Past Medical History:  Diagnosis Date  . Hyperlipidemia   . Hypertension   . Varicocele     Past Surgical History:  Procedure Laterality Date  . BACK SURGERY    . bilateral groin hernia    . left foot surgery   2018   bone spur   . radical mastoidectomy    . reconstructive ear surgery      There were no vitals filed for this visit.  Subjective Assessment - 07/13/19 1202    Subjective  Pt reports no irritation or noticable difference after ionto patch.  He noticed his Rt elbow is no longer as tender to the touch.    Currently in Pain?  No/denies    Pain Score  0-No pain         [facility] PT Assessment - 07/13/19 0001      Assessment   Medical Diagnosis  R medial epicondylitis and cubital tunnel syndrome    Referring Provider (PT)  Candi Leash, MD    Onset Date/Surgical Date  06/27/19    Hand Dominance  Right    Prior Therapy  none      Strength   Strength Assessment Site  Hand    Right/Left hand  Left;Right    Right Hand Grip (lbs)  75    Left Hand Grip (lbs)  69       [facility] Adult PT Treatment/Exercise - 07/13/19 0001      Shoulder Exercises: Supine   Horizontal ABduction  Strengthening;Both;12 reps    Theraband Level (Shoulder Horizontal ABduction)  Level 3 (Green)    External Rotation   Strengthening;Both;12 reps    Theraband Level (Shoulder External Rotation)  Level 3 (Green)      Shoulder Exercises: Sidelying   Other Sidelying Exercises  open book with green band x 12 reps each direction      Shoulder Exercises: Stretch   Table Stretch - Flexion  10 seconds   standing, hands on back of truck.    Other Shoulder Stretches  midlevel doorway stretch (bilat) x 20 sec x 2;  short trial of high level unilateral doorway stretch RUE, not tolerated.  Trial of Rt tricep stretch with door frame assist; not tolerated.  Trial of pec stretch with elbow straight x 10 sec (for demo of form with HEP exercise)     Other Shoulder Stretches  Rt wrist flexors stretch x 10 sec.        Iontophoresis   Type of Iontophoresis  Dexamethasone    Location  Rt common wrist flexor tendon just medial to epicondyle.     Dose  1.0 cc    Time  80 mA stat patch; 4 hr wear time.  Manual Therapy   Soft tissue mobilization  IASTM to Rt wrist flexors, prox wrist extensors, distal to mid Rt tricep, Rt pec, levator, teres minor/major to decrease fascial restrictions and improve ROM.                  PT Long Term Goals - 07/05/19 2049      PT LONG TERM GOAL #1   Title  The patient will be indep with HEP for R shoulder, elbow, and wrist flexibility and strengthening.    Time  6    Period  Weeks    Target Date  08/16/19      PT LONG TERM GOAL #2   Title  The patient will reduce functional limitation from 50% to < or equal to 36% per FOTO.    Time  6    Period  Weeks    Target Date  08/16/19      PT LONG TERM GOAL #3   Title  The patient will be able to lift 6bs to overhead shelf using R UE without elbow pain.    Time  6    Period  Weeks    Target Date  08/16/19      PT LONG TERM GOAL #4   Title  The patient will improve R shoulder strength to 5/5.    Time  6    Period  Weeks    Target Date  08/16/19      PT LONG TERM GOAL #5   Title  The patient will report pain < or equal to  4/10 at worst.    Time  6    Period  Weeks    Target Date  08/16/19            Plan - 07/13/19 1242    Clinical Impression Statement  Hand grip tested; WNL. Positive response to ionto patch last visit and stretches; pt reporting less point tenderness in Rt medial elbow.  Pt tolerated exercises well, without increase in pain.  Pt demonstrated limited Rt shoulder flexion ROM due to pain in shoulder at available end range; improved tolerance and available range after IASTM to Rt pec, tricep, teres major/lat, and Rt wrist flexors.  Pt progressing towards goals.    Rehab Potential  Good    PT Frequency  2x / week    PT Duration  6 weeks    PT Treatment/Interventions  ADLs/Self Care Home Management;Iontophoresis 4mg /ml Dexamethasone;Cryotherapy;Electrical Stimulation;Moist Heat;Ultrasound;Therapeutic exercise;Therapeutic activities;Taping;Dry needling;Manual techniques;Patient/family education    PT Next Visit Plan  continue manual work, ionto, RUE flexibility and strengthening    PT Home Exercise Plan  Access Code: [email or web address]    Consulted and Agree with Plan of Care  Patient       Patient will benefit from skilled therapeutic intervention in order to improve the following deficits and impairments:  Pain, Decreased strength, Decreased range of motion, Postural dysfunction, Impaired flexibility  Visit Diagnosis: Pain in right elbow  Muscle weakness (generalized)  Abnormal posture     Problem List Patient Active Problem List   Diagnosis Date Noted  . Hearing loss 08/09/2018  . 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 Gastroesophageal reflux disease with esophagitis 01/07/2017  . Oral lesion 01/07/2017  . Essential hypertension 01/04/2010   Kerin Perna, PTA 07/13/19 12:54 PM  [place], [place], [address] Phone: [phone]   Fax:  [phone]  Name: THOREN HOSANG MRN: [id number]  Date of Birth: 1960-10-04

## 2019-07-17 ENCOUNTER — Encounter: Payer: Self-pay | Admitting: Physical Therapy

## 2019-07-17 ENCOUNTER — Other Ambulatory Visit: Payer: Self-pay

## 2019-07-17 ENCOUNTER — Ambulatory Visit (INDEPENDENT_AMBULATORY_CARE_PROVIDER_SITE_OTHER): Payer: BC Managed Care – PPO | Admitting: Physical Therapy

## 2019-07-17 DIAGNOSIS — M6281 Muscle weakness (generalized): Secondary | ICD-10-CM

## 2019-07-17 DIAGNOSIS — M25521 Pain in right elbow: Secondary | ICD-10-CM | POA: Diagnosis not present

## 2019-07-17 DIAGNOSIS — R293 Abnormal posture: Secondary | ICD-10-CM | POA: Diagnosis not present

## 2019-07-17 NOTE — Therapy (Signed)
[facility] [place] [address] [place], [place], [address] Phone: [phone]   Fax:  [phone]  Physical Therapy Treatment  Patient Details  Name: Ethan Bennett MRN: [id number] Date of Birth: 07-07-1960 Referring Provider (PT): Candi Leash, MD   Encounter Date: 07/17/2019  PT End of Session - 07/17/19 1155    Visit Number  4    Number of Visits  12    Date for PT Re-Evaluation  08/16/19    PT Start Time  1150    PT Stop Time  1228    PT Time Calculation (min)  38 min    Activity Tolerance  Patient tolerated treatment well;No increased pain    Behavior During Therapy  WFL for tasks assessed/performed       Past Medical History:  Diagnosis Date  . Hyperlipidemia   . Hypertension   . Varicocele     Past Surgical History:  Procedure Laterality Date  . BACK SURGERY    . bilateral groin hernia    . left foot surgery   2018   bone spur   . radical mastoidectomy    . reconstructive ear surgery      There were no vitals filed for this visit.  Subjective Assessment - 07/17/19 1254    Subjective  Pt reports he was sore in his forearm the day following the last therapy session.  He states raising his Rt arm overhead is going better.  He completed some of his stretches prior to today's session.    Patient Stated Goals  reduce pain    Currently in Pain?  No/denies    Pain Score  0-No pain         [facility] PT Assessment - 07/17/19 0001      Assessment   Medical Diagnosis  R medial epicondylitis and cubital tunnel syndrome    Referring Provider (PT)  Candi Leash, MD    Onset Date/Surgical Date  06/27/19    Hand Dominance  Right    Prior Therapy  none      [facility] Adult PT Treatment/Exercise - 07/17/19 0001      Elbow Exercises   Other elbow exercises  Rt wrist ext / flex stretch x 15 sec each direction       Shoulder Exercises: Supine   Diagonals  Right;10 reps;Strengthening    Theraband Level  (Shoulder Diagonals)  Level 3 (Green)      Shoulder Exercises: Standing   Diagonals  Strengthening;Right;10 reps;Theraband    Theraband Level (Shoulder Diagonals)  Level 2 (Red);Level 3 (Green)   10 reps each (red, green)   Other Standing Exercises  Rt hand with back of palm on ball (on wall) shoulder abdct 70 deg, elbow bent to 90 deg - CW/CCW x 20       Shoulder Exercises: ROM/Strengthening   UBE (Upper Arm Bike)  L2: 1 min each direction, standing      Shoulder Exercises: Stretch   Other Shoulder Stretches  midlevel doorway stretch x 20 sec x 2 reps;  Rt unilateral high doorway stretch x 20 sec       Manual Therapy   Soft tissue mobilization  IASTM and STM (including TPR) to Rt wrist flexors, prox wrist extensors, distal to mid Rt bicep and tricep, bilat upper trap, levator to decrease fascial restrictions and improve ROM.                  PT Long Term Goals - 07/05/19 2049  PT LONG TERM GOAL #1   Title  The patient will be indep with HEP for R shoulder, elbow, and wrist flexibility and strengthening.    Time  6    Period  Weeks    Target Date  08/16/19      PT LONG TERM GOAL #2   Title  The patient will reduce functional limitation from 50% to < or equal to 36% per FOTO.    Time  6    Period  Weeks    Target Date  08/16/19      PT LONG TERM GOAL #3   Title  The patient will be able to lift 6bs to overhead shelf using R UE without elbow pain.    Time  6    Period  Weeks    Target Date  08/16/19      PT LONG TERM GOAL #4   Title  The patient will improve R shoulder strength to 5/5.    Time  6    Period  Weeks    Target Date  08/16/19      PT LONG TERM GOAL #5   Title  The patient will report pain < or equal to 4/10 at worst.    Time  6    Period  Weeks    Target Date  08/16/19            Plan - 07/17/19 1240    Clinical Impression Statement  Pt is less tender in Rt wrist flexors at common insertion; now more tender in Rt extensor carpi  ulnaris and extensor digitorum (proximally). Pt had some neck irritation with D2 flexion exercise; not added to HEP at this time.  Pt continues to feel minimal stretch in Rt pec with high level doorway stretch, instead only feels discomfort in superior portion of Rt shoulder. Held ionto since pt is no longer as tender at medial epicondyle. Pt progressing towards LTGs.    Rehab Potential  Good    PT Frequency  2x / week    PT Duration  6 weeks    PT Treatment/Interventions  ADLs/Self Care Home Management;Iontophoresis 4mg /ml Dexamethasone;Cryotherapy;Electrical Stimulation;Moist Heat;Ultrasound;Therapeutic exercise;Therapeutic activities;Taping;Dry needling;Manual techniques;Patient/family education    PT Next Visit Plan  continue manual work, ionto, RUE flexibility and strengthening    PT Home Exercise Plan  Access Code: [email or web address]    Consulted and Agree with Plan of Care  Patient       Patient will benefit from skilled therapeutic intervention in order to improve the following deficits and impairments:  Pain, Decreased strength, Decreased range of motion, Postural dysfunction, Impaired flexibility  Visit Diagnosis: Pain in right elbow  Muscle weakness (generalized)  Abnormal posture     Problem List Patient Active Problem List   Diagnosis Date Noted  . Hearing loss 08/09/2018  . 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 Gastroesophageal reflux disease with esophagitis 01/07/2017  . Oral lesion 01/07/2017  . Essential hypertension 01/04/2010   Kerin Perna, PTA 07/17/19 12:59 PM  [place], [place], [address] Phone: [phone]   Fax:  [phone]  Name: Ethan Bennett MRN: [id number] Date of Birth: 1961/01/21

## 2019-07-19 ENCOUNTER — Other Ambulatory Visit: Payer: Self-pay

## 2019-07-19 ENCOUNTER — Ambulatory Visit: Payer: BC Managed Care – PPO | Admitting: Rehabilitative and Restorative Service Providers"

## 2019-07-19 DIAGNOSIS — R293 Abnormal posture: Secondary | ICD-10-CM | POA: Diagnosis not present

## 2019-07-19 DIAGNOSIS — M25521 Pain in right elbow: Secondary | ICD-10-CM | POA: Diagnosis not present

## 2019-07-19 DIAGNOSIS — M6281 Muscle weakness (generalized): Secondary | ICD-10-CM

## 2019-07-19 NOTE — Therapy (Signed)
[place], [place], [address] Phone: [phone]   Fax:  [phone]  Physical Therapy Treatment  Patient Details  Name: Ethan Bennett MRN: [id number] Date of Birth: [birth year] Referring Provider (PT): Harlin Heys, MD   Encounter Date: 07/19/2019  PT End of Session - 07/19/19 1236    Visit Number  5    Number of Visits  12    Date for PT Re-Evaluation  08/16/19    PT Start Time  0718    PT Stop Time  0800    PT Time Calculation (min)  42 min    Activity Tolerance  Patient tolerated treatment well;No increased pain    Behavior During Therapy  WFL for tasks assessed/performed       Past Medical History:  Diagnosis Date  . Hyperlipidemia   . Hypertension   . Varicocele     Past Surgical History:  Procedure Laterality Date  . BACK SURGERY    . bilateral groin hernia    . left foot surgery   2018   bone spur   . radical mastoidectomy    . reconstructive ear surgery      There were no vitals filed for this visit.  Subjective Assessment - 07/19/19 0720    Subjective  The patient reports he gets sore with exercise.  He opened a jar the other day and notes it felt easier.    Patient Stated Goals  reduce pain    Currently in Pain?  No/denies                        [facility] Adult PT Treatment/Exercise - 07/19/19 0721      Exercises   Exercises  Elbow;Shoulder      Elbow Exercises   Forearm Supination  Strengthening;Right;10 reps    Bar Weights/Barbell (Forearm Supination)  3 lbs    Forearm Pronation  Strengthening;Right;10 reps    Bar Weights/Barbell (Forearm Pronation)  3 lbs      Shoulder Exercises: Supine   Diagonals  Right;10 reps    Diagonals Limitations  with end range overpressure    Other Supine Exercises  some discomfort R shoulder with flexion/scaption end range      Shoulder Exercises: Sidelying   Other Sidelying Exercises  scapular mobiization L  sidelying for R shoulder, with D1/D2 scapular pattern with facilitation      Shoulder Exercises: Standing   Horizontal ABduction  Strengthening;Both;10 reps    Diagonals  --    Other Standing Exercises  Overhead press x 10 reps 3 lb bilat    Other Standing Exercises  *did not progress HEP to standing due to "impinging" sensation R anterior shoulder-- recommended continued HEP supine for strength      Shoulder Exercises: ROM/Strengthening   UBE (Upper Arm Bike)  L4:  2 minutes forward/ 2 minutes backwards      Shoulder Exercises: Stretch   Other Shoulder Stretches  Mid level doorway stretch bilat x 20 sec x 1, then with V stretch x 20 sec.  R bicep stretch/pec stretch with R hand against wall      Manual Therapy   Manual Therapy  Joint mobilization;Soft tissue mobilization    Manual therapy comments  to reduce shoulder/elbow pain    Joint Mobilization  GH inferior grade II mobilization R shoulder    Soft tissue mobilization  myofascial pec release, IASTM/STM R anterior pec, middle deltoid, and biceps  PT Long Term Goals - 07/19/19 1237      PT LONG TERM GOAL #1   Title  The patient will be indep with HEP for R shoulder, elbow, and wrist flexibility and strengthening.    Time  6    Period  Weeks      PT LONG TERM GOAL #2   Title  The patient will reduce functional limitation from 50% to < or equal to 36% per FOTO.    Time  6    Period  Weeks      PT LONG TERM GOAL #3   Title  The patient will be able to lift 6bs to overhead shelf using R UE without elbow pain.    Time  6    Period  Weeks      PT LONG TERM GOAL #4   Title  The patient will improve R shoulder strength to 5/5.    Time  6    Period  Weeks      PT LONG TERM GOAL #5   Title  The patient will report pain < or equal to 4/10 at worst.    Baseline  no pain noted 07/19/19    Time  6    Period  Weeks    Status  Achieved            Plan - 07/19/19 1241    Clinical Impression  Statement  The patient has met one LTG.  PT to check remaining LTGs next visit and plan for d/c.  The patient is tolerating ther ex well and PT discussed ongoing need for shoulder and UE strengthening to reduce pain.    PT Treatment/Interventions  ADLs/Self Care Home Management;Iontophoresis 61m/ml Dexamethasone;Cryotherapy;Electrical Stimulation;Moist Heat;Ultrasound;Therapeutic exercise;Therapeutic activities;Taping;Dry needling;Manual techniques;Patient/family education    PT Next Visit Plan  check LTGs, FOTO, progress strengthening    PT Home Exercise Plan  Access Code: [email or web address]   Consulted and Agree with Plan of Care  Patient       Patient will benefit from skilled therapeutic intervention in order to improve the following deficits and impairments:  Pain, Decreased strength, Decreased range of motion, Postural dysfunction, Impaired flexibility  Visit Diagnosis: Pain in right elbow  Muscle weakness (generalized)  Abnormal posture     Problem List Patient Active Problem List   Diagnosis Date Noted  . Hearing loss 08/09/2018  . 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 Gastroesophageal reflux disease with esophagitis 01/07/2017  . Oral lesion 01/07/2017  . Essential hypertension 01/04/2010    Jaymen Fetch, PT 07/19/2019, 12:42 PM  [facility] [address]Phone: [phone]  Fax:  [phone] Name: Ethan MCDIARMIDMRN: [id number] of Birth: 501-04-1960

## 2019-07-26 ENCOUNTER — Other Ambulatory Visit: Payer: Self-pay

## 2019-07-26 ENCOUNTER — Ambulatory Visit: Payer: BC Managed Care – PPO | Admitting: Rehabilitative and Restorative Service Providers"

## 2019-07-26 DIAGNOSIS — M25521 Pain in right elbow: Secondary | ICD-10-CM

## 2019-07-26 DIAGNOSIS — M6281 Muscle weakness (generalized): Secondary | ICD-10-CM

## 2019-07-26 DIAGNOSIS — R293 Abnormal posture: Secondary | ICD-10-CM

## 2019-07-26 NOTE — Therapy (Addendum)
[place], [place], [address] Phone: [phone]   Fax:  [phone]  Physical Therapy Treatment/On Hold Note/ discharge summary  Patient Details  Name: Ethan Bennett MRN: [id number] Date of Birth: 1961/02/16 Referring Provider (PT): Harlin Heys, MD   Encounter Date: 07/26/2019  PT End of Session - 07/26/19 0723    Visit Number  6    Number of Visits  12    Date for PT Re-Evaluation  08/16/19    PT Start Time  0718    PT Stop Time  0800    PT Time Calculation (min)  42 min    Activity Tolerance  Patient tolerated treatment well    Behavior During Therapy  [facility] for tasks assessed/performed       Past Medical History:  Diagnosis Date  . Hyperlipidemia   . Hypertension   . Varicocele     Past Surgical History:  Procedure Laterality Date  . BACK SURGERY    . bilateral groin hernia    . left foot surgery   2018   bone spur   . radical mastoidectomy    . reconstructive ear surgery      There were no vitals filed for this visit.  Subjective Assessment - 07/26/19 0719    Subjective  The patient reports he was sore in the R elbow over the weekend.  He stopped doing some of the exercises.  He notes the elbow (around medial epicondyle region) had pain.    Patient Stated Goals  reduce pain    Currently in Pain?  No/denies                        [facility] Adult PT Treatment/Exercise - 07/26/19 0728      Exercises   Exercises  Elbow;Shoulder      Elbow Exercises   Forearm Supination  Strengthening;Right    Forearm Supination Limitations  isometrics    Forearm Pronation  Strengthening;Right;10 reps    Forearm Pronation Limitations  isometrics    Other elbow exercises  resisted R wrist flexion      Shoulder Exercises: Supine   External Rotation  Strengthening;Both;12 reps    ABduction  Strengthening;Both;10 reps    Theraband Level (Shoulder ABduction)  Level 3  (Green)      Shoulder Exercises: Sidelying   ABduction  Strengthening;Right;12 reps    Theraband Level (Shoulder ABduction)  Level 3 (Green)      Shoulder Exercises: Standing   Flexion  Strengthening;Both;12 reps    Shoulder Flexion Weight (lbs)  3    Flexion Limitations  scaption    ABduction  Strengthening;Both;12 reps    Shoulder ABduction Weight (lbs)  3    Row  Strengthening;Both;12 reps    Theraband Level (Shoulder Row)  Level 2 (Red)    Other Standing Exercises  Overhead press x 5 reps with 8 lbs R and L sides      Manual Therapy   Manual Therapy  Joint mobilization;Soft tissue mobilization    Manual therapy comments  to reduce elbow pain    Joint Mobilization  radial ulnar mobiization grade II    Soft tissue mobilization  pronator, extensor ulnaris             PT Education - 07/26/19 0804    Education Details  discussed isometric pronation/supination and recommended use of ice massage to reduce inflammation    Person(s) Educated  Patient  Methods  Explanation;Demonstration;Handout    Comprehension  Verbalized understanding;Returned demonstration          PT Long Term Goals - 07/26/19 0806      PT LONG TERM GOAL #1   Title  The patient will be indep with HEP for R shoulder, elbow, and wrist flexibility and strengthening.    Time  6    Period  Weeks      PT LONG TERM GOAL #2   Title  The patient will reduce functional limitation from 50% to < or equal to 36% per FOTO.    Baseline  30% limitation    Time  6    Period  Weeks    Status  Achieved      PT LONG TERM GOAL #3   Title  The patient will be able to lift 6bs to overhead shelf using R UE without elbow pain.    Baseline  demonstrated 8 lbs R and L UEs with overhead press.    Time  6    Period  Weeks    Status  Achieved      PT LONG TERM GOAL #4   Title  The patient will improve R shoulder strength to 5/5.    Baseline  did not retest    Time  6    Period  Weeks    Status  Deferred      PT  LONG TERM GOAL #5   Title  The patient will report pain < or equal to 4/10 at worst.    Baseline  no pain noted 07/19/19; noted soreness at visit on 5/26 that could be related to addition of pronation/supination    Time  6    Period  Weeks    Status  Achieved            Plan - 07/26/19 4098    Clinical Impression Statement  The patient has partially met LTGs.  He had increased pain after last session that may be related to addition of resisted pronation/supination last week. We reviewed HEP and modified ther ex to introduce isometrics for pronation/supination and avoided weighted pronation/supination in today's session.  Patient has tenderness to palpation R pronator teres and R extensor carpi ulnaris.  PT recommended ice massage for sef mgmt and emphasized need for continued strengthening.    PT Treatment/Interventions  ADLs/Self Care Home Management;Iontophoresis 27m/ml Dexamethasone;Cryotherapy;Electrical Stimulation;Moist Heat;Ultrasound;Therapeutic exercise;Therapeutic activities;Taping;Dry needling;Manual techniques;Patient/family education    PT Next Visit Plan  on hold x 30 days.    PT Home Exercise Plan  Access Code: [email or web address]   Consulted and Agree with Plan of Care  Patient        This was last patient appointment scheduled.  Patient had increased pain after last session and we discussed modifications to HEP to reduce soreness and use of ice to reduce inflammation.  Patient will benefit from skilled therapeutic intervention in order to improve the following deficits and impairments:  Pain, Decreased strength, Decreased range of motion, Postural dysfunction, Impaired flexibility  Visit Diagnosis: Pain in right elbow  Muscle weakness (generalized)  Abnormal posture  PHYSICAL THERAPY DISCHARGE SUMMARY  Visits from Start of Care: 6  Current functional level related to goals / functional outcomes: See goals above.   Remaining deficits: Patient has R elbow discomfort at  last session due to addition of resisted pronation/supination.  PT had modified HEP to isometric strengthening instead of isokinetic.   Patient was planning to work on HONEOKand return  to clinic if needs arose.  No further contact.   Education / Equipment: Home program.  Plan: Patient agrees to discharge.  Patient goals were partially met. Patient is being discharged due to not returning since the last visit.  ?????         Thank you for the referral of this patient. Rudell Cobb, MPT  [place], PT 07/26/2019, [place], [place], [address] Phone: [phone]   Fax:  [phone]  Name: Ethan Bennett MRN: [id number] Date of Birth: 05/11/1960

## 2019-07-26 NOTE — Patient Instructions (Signed)
Access Code: [email or web address] URL: [email or web address] Date: 07/26/2019 Prepared by: Margretta Ditty  Exercises Ulnar Nerve Flossing - 2 x daily - 7 x weekly - 10 reps - 1 sets Doorway Pec Stretch at 90 Degrees Abduction - 2 x daily - 7 x weekly - 1 sets - 3 reps - 20 seconds hold Standing Pec Stretch at Wall - 2 x daily - 7 x weekly - 10 reps - 1 sets Supine Shoulder Horizontal Abduction with Resistance - 1 x daily - 7 x weekly - 2 sets - 10 reps Supine Shoulder External Rotation with Resistance - 1 x daily - 7 x weekly - 2 sets - 10 reps Sidelying Thoracic Rotation with Open Book - 1 x daily - 7 x weekly - 1 sets - 10 reps Standing Shoulder Row with Anchored Resistance - 1 x daily - 7 x weekly - 2 sets - 10 reps Isometric Wrist Pronation - 2 x daily - 7 x weekly - 1 sets - 5 reps - 5 seconds hold Seated Isometric Forearm Supination - 2 x daily - 7 x weekly - 1 sets - 5 reps - 5 seconds hold

## 2019-07-28 ENCOUNTER — Encounter: Payer: BC Managed Care – PPO | Admitting: Rehabilitative and Restorative Service Providers"

## 2019-08-17 ENCOUNTER — Encounter: Payer: Self-pay | Admitting: Family Medicine

## 2019-08-17 DIAGNOSIS — J309 Allergic rhinitis, unspecified: Secondary | ICD-10-CM

## 2019-08-17 MED ORDER — AZELASTINE HCL 0.1 % NA SOLN: 2.0000 | Freq: Two times a day (BID) | NASAL | 11 refills | Status: DC

## 2019-09-14 ENCOUNTER — Other Ambulatory Visit: Payer: Self-pay | Admitting: Family Medicine

## 2019-11-16 ENCOUNTER — Encounter: Payer: Self-pay | Admitting: Family Medicine

## 2019-11-16 NOTE — Telephone Encounter (Signed)
MyChart msg sent to the patient to schedule an appointment with provider.

## 2019-12-18 ENCOUNTER — Other Ambulatory Visit: Payer: Self-pay

## 2019-12-18 DIAGNOSIS — I10 Essential (primary) hypertension: Secondary | ICD-10-CM

## 2019-12-18 MED ORDER — LISINOPRIL 20 MG PO TABS: 20.0000 mg | ORAL_TABLET | Freq: Every day | ORAL | 0 refills | Status: DC

## 2020-01-01 ENCOUNTER — Encounter: Payer: Self-pay | Admitting: Family Medicine

## 2020-01-01 ENCOUNTER — Telehealth (INDEPENDENT_AMBULATORY_CARE_PROVIDER_SITE_OTHER): Payer: BC Managed Care – PPO | Admitting: Family Medicine

## 2020-01-01 DIAGNOSIS — I10 Essential (primary) hypertension: Secondary | ICD-10-CM | POA: Diagnosis not present

## 2020-01-01 DIAGNOSIS — Z125 Encounter for screening for malignant neoplasm of prostate: Secondary | ICD-10-CM

## 2020-01-01 DIAGNOSIS — B009 Herpesviral infection, unspecified: Secondary | ICD-10-CM | POA: Diagnosis not present

## 2020-01-01 DIAGNOSIS — K2100 Gastro-esophageal reflux disease with esophagitis, without bleeding: Secondary | ICD-10-CM | POA: Diagnosis not present

## 2020-01-01 MED ORDER — LISINOPRIL 20 MG PO TABS: 20.0000 mg | ORAL_TABLET | Freq: Every day | ORAL | 1 refills | Status: DC

## 2020-01-01 MED ORDER — FINASTERIDE 5 MG PO TABS: 5.0000 mg | ORAL_TABLET | Freq: Every day | ORAL | 1 refills | Status: DC

## 2020-01-01 NOTE — Progress Notes (Signed)
Established Patient Office Visit  Subjective:  Patient ID: Ethan Bennett, male    DOB: 1960-05-28  Age: 59 y.o. MRN: [id number]  CC:  Chief Complaint  Patient presents with  . Hypertension    HPI Ethan Bennett presents for Hypertension- Pt denies chest pain, SOB, dizziness, or heart palpitations.  Taking meds as directed w/o problems.  Denies medication side effects.  Occ checks for BP.    He reports that his eye exam is up-to-date he sees Dr. Clearance Coots.  Follow-up GERD-he does take his Prevacid daily he says the insurance will no longer cover it so he is just been buying the medication over-the-counter but it does work well.  Past Medical History:  Diagnosis Date  . Hyperlipidemia   . Hypertension   . Varicocele     Past Surgical History:  Procedure Laterality Date  . BACK SURGERY    . bilateral groin hernia    . left foot surgery   2018   bone spur   . radical mastoidectomy    . reconstructive ear surgery      Family History  Problem Relation Age of Onset  . Hypertension Mother   . Cancer - Other Father        biliary    Social History   Socioeconomic History  . Marital status: Married    Spouse name: Not on file  . Number of children: Not on file  . Years of education: Not on file  . Highest education level: Not on file  Occupational History  . Not on file  Tobacco Use  . Smoking status: Former Smoker    Quit date: 01/07/1981    Years since quitting: 39.0  . Smokeless tobacco: Former Engineer, water and Sexual Activity  . Alcohol use: Yes    Comment: 8-10 drinks a week   . Drug use: No  . Sexual activity: Yes  Other Topics Concern  . Not on file  Social History Narrative  . Not on file   Social Determinants of Health   Financial Resource Strain:   . Difficulty of Paying Living Expenses: Not on file  Food Insecurity:   . Worried About Programme researcher, broadcasting/film/video in the Last Year: Not on file  . Ran Out of Food in the Last Year: Not on file   Transportation Needs:   . Lack of Transportation (Medical): Not on file  . Lack of Transportation (Non-Medical): Not on file  Physical Activity:   . Days of Exercise per Week: Not on file  . Minutes of Exercise per Session: Not on file  Stress:   . Feeling of Stress : Not on file  Social Connections:   . Frequency of Communication with Friends and Family: Not on file  . Frequency of Social Gatherings with Friends and Family: Not on file  . Attends Religious Services: Not on file  . Active Member of Clubs or Organizations: Not on file  . Attends Banker Meetings: Not on file  . Marital Status: Not on file  Intimate Partner Violence:   . Fear of Current or Ex-Partner: Not on file  . Emotionally Abused: Not on file  . Physically Abused: Not on file  . Sexually Abused: Not on file    Outpatient Medications Prior to Visit  Medication Sig Dispense Refill  . azelastine (ASTELIN) 0.1 % nasal spray Place 2 sprays into both nostrils 2 (two) times daily. Use in each nostril as directed 30 mL 11  .  lansoprazole (PREVACID) 30 MG capsule TAKE ONE CAPSULE BY MOUTH DAILY AT 12:00 PM NOON 90 capsule 3  . RESTASIS 0.05 % ophthalmic emulsion     . finasteride (PROSCAR) 5 MG tablet Take 1 tablet (5 mg total) by mouth daily. 90 tablet 1  . lisinopril (ZESTRIL) 20 MG tablet Take 1 tablet (20 mg total) by mouth daily. **PATIENT NEEDS OFFICE VISIT FOR ADDITIONAL REFILLS** 30 tablet 0  . ketoconazole (NIZORAL) 2 % cream Apply 1 application topically 2 (two) times daily. X 14 days 60 g 1  . valACYclovir (VALTREX) 1000 MG tablet Take 1 tablet (1,000 mg total) by mouth 2 (two) times daily as needed. 20 tablet 1   No facility-administered medications prior to visit.    No Known Allergies  ROS Review of Systems    Objective:    Physical Exam Vitals reviewed.  Constitutional:      Appearance: He is well-developed.  HENT:     Head: Normocephalic and atraumatic.  Eyes:      Conjunctiva/sclera: Conjunctivae normal.  Cardiovascular:     Rate and Rhythm: Normal rate.  Pulmonary:     Effort: Pulmonary effort is normal.  Skin:    General: Skin is dry.     Coloration: Skin is not pale.  Neurological:     Mental Status: He is alert and oriented to person, place, and time.  Psychiatric:        Behavior: Behavior normal.     There were no vitals taken for this visit. Wt Readings from Last 3 Encounters:  08/08/18 205 lb (93 kg)  03/28/18 206 lb (93.4 kg)  02/16/18 202 lb (91.6 kg)     There are no preventive care reminders to display for this patient.  There are no preventive care reminders to display for this patient.  No results found for: TSH Lab Results  Component Value Date   WBC 5.2 01/12/2017   HGB 14.8 01/12/2017   HCT 43.1 01/12/2017   MCV 90.9 01/12/2017   PLT 231 01/12/2017   Lab Results  Component Value Date   NA 141 03/28/2018   K 4.6 03/28/2018   CO2 31 03/28/2018   GLUCOSE 98 03/28/2018   BUN 18 03/28/2018   CREATININE 0.88 03/28/2018   BILITOT 0.4 03/28/2018   ALKPHOS 66 06/12/2015   AST 16 03/28/2018   ALT 18 03/28/2018   PROT 6.8 03/28/2018   CALCIUM 9.4 03/28/2018   Lab Results  Component Value Date   CHOL 224 (H) 03/28/2018   Lab Results  Component Value Date   HDL 60 03/28/2018   Lab Results  Component Value Date   LDLCALC 144 (H) 03/28/2018   Lab Results  Component Value Date   TRIG 94 03/28/2018   Lab Results  Component Value Date   CHOLHDL 3.7 03/28/2018   Lab Results  Component Value Date   HGBA1C 5.4 03/28/2018      Assessment & Plan:   Problem List Items Addressed This Visit      Cardiovascular and Mediastinum   Essential hypertension    Refilled medication due for updated blood work.  He reports that he checks his blood pressure occasionally at home but did not have a blood pressure for [place] today.  Encouraged him to continue to check it periodically and the let us know if it is  elevated.      Relevant Medications   lisinopril (ZESTRIL) 20 MG tablet   Other Relevant Orders   COMPLETE METABOLIC PANEL  WITH GFR   Lipid Panel w/reflex Direct LDL     Digestive   Gastroesophageal reflux disease with esophagitis    Using Prevacid OTC.  Symptoms are well controlled.        Other   HSV-1 (herpes simplex virus 1) infection    Uses valacyclovir as needed but does not need a refill today.       Other Visit Diagnoses    Screening for prostate cancer    -  Primary   Relevant Orders   PSA     Discussed need for shingles vaccine. Encouraged him to consider getting the vaccine at his next in person visit.  Meds ordered this encounter  Medications  . lisinopril (ZESTRIL) 20 MG tablet    Sig: Take 1 tablet (20 mg total) by mouth daily.    Dispense:  90 tablet    Refill:  1  . finasteride (PROSCAR) 5 MG tablet    Sig: Take 1 tablet (5 mg total) by mouth daily.    Dispense:  90 tablet    Refill:  1    Follow-up: Return in about 6 months (around 06/30/2020) for Hypertension.    Nani Gasser, MD

## 2020-01-01 NOTE — Progress Notes (Signed)
m °

## 2020-01-01 NOTE — Assessment & Plan Note (Signed)
Refilled medication due for updated blood work.  He reports that he checks his blood pressure occasionally at home but did not have a blood pressure for [place] today.  Encouraged him to continue to check it periodically and the let us know if it is elevated.

## 2020-01-01 NOTE — Assessment & Plan Note (Signed)
Using Prevacid OTC.  Symptoms are well controlled.

## 2020-01-01 NOTE — Assessment & Plan Note (Signed)
Uses valacyclovir as needed but does not need a refill today.

## 2020-02-15 ENCOUNTER — Other Ambulatory Visit: Payer: Self-pay | Admitting: *Deleted

## 2020-02-15 ENCOUNTER — Encounter: Payer: Self-pay | Admitting: Family Medicine

## 2020-02-15 MED ORDER — BETAMETHASONE DIPROPIONATE AUG 0.05 % EX LOTN: TOPICAL_LOTION | CUTANEOUS | 0 refills | Status: DC

## 2020-02-27 DIAGNOSIS — Z125 Encounter for screening for malignant neoplasm of prostate: Secondary | ICD-10-CM | POA: Diagnosis not present

## 2020-02-27 DIAGNOSIS — I10 Essential (primary) hypertension: Secondary | ICD-10-CM | POA: Diagnosis not present

## 2020-02-28 LAB — LIPID PANEL W/REFLEX DIRECT LDL
Cholesterol: 274 mg/dL — ABNORMAL HIGH (ref <200)
HDL: 76 mg/dL (ref >=40)
LDL Cholesterol (Calc): 177 mg/dL (calc) — ABNORMAL HIGH
Non-HDL Cholesterol (Calc): 198 mg/dL (calc) — ABNORMAL HIGH (ref <130)
Total CHOL/HDL Ratio: 3.6 (calc) (ref <5.0)
Triglycerides: 93 mg/dL (ref <150)

## 2020-02-28 LAB — COMPLETE METABOLIC PANEL WITH GFR
AG Ratio: 1.7 (calc) (ref 1.0–2.5)
ALT: 24 U/L (ref 9–46)
AST: 19 U/L (ref 10–35)
Albumin: 4.3 g/dL (ref 3.6–5.1)
Alkaline phosphatase (APISO): 70 U/L (ref 35–144)
BUN: 16 mg/dL (ref 7–25)
CO2: 31 mmol/L (ref 20–32)
Calcium: 9.7 mg/dL (ref 8.6–10.3)
Chloride: 102 mmol/L (ref 98–110)
Creat: 0.94 mg/dL (ref 0.70–1.33)
GFR, Est African American: 102 mL/min/{1.73_m2} (ref >=60)
GFR, Est Non African American: 88 mL/min/{1.73_m2} (ref >=60)
Globulin: 2.6 g/dL (calc) (ref 1.9–3.7)
Glucose, Bld: 102 mg/dL — ABNORMAL HIGH (ref 65–99)
Potassium: 4.5 mmol/L (ref 3.5–5.3)
Sodium: 139 mmol/L (ref 135–146)
Total Bilirubin: 0.5 mg/dL (ref 0.2–1.2)
Total Protein: 6.9 g/dL (ref 6.1–8.1)

## 2020-02-28 LAB — PSA: PSA: 0.1 ng/mL (ref <=4.0)

## 2020-02-29 ENCOUNTER — Encounter: Payer: Self-pay | Admitting: Family Medicine

## 2020-02-29 DIAGNOSIS — I10 Essential (primary) hypertension: Secondary | ICD-10-CM

## 2020-02-29 DIAGNOSIS — E785 Hyperlipidemia, unspecified: Secondary | ICD-10-CM

## 2020-02-29 MED ORDER — ATORVASTATIN CALCIUM 40 MG PO TABS
40.0000 mg | ORAL_TABLET | Freq: Every day | ORAL | 3 refills | Status: DC
Start: 2020-02-29 — End: 2020-11-19

## 2020-02-29 MED ORDER — LISINOPRIL 40 MG PO TABS: 40.0000 mg | ORAL_TABLET | Freq: Every day | ORAL | 1 refills | Status: DC

## 2020-03-06 DIAGNOSIS — M25511 Pain in right shoulder: Secondary | ICD-10-CM | POA: Diagnosis not present

## 2020-04-01 DIAGNOSIS — M25511 Pain in right shoulder: Secondary | ICD-10-CM | POA: Diagnosis not present

## 2020-04-09 DIAGNOSIS — M25511 Pain in right shoulder: Secondary | ICD-10-CM | POA: Diagnosis not present

## 2020-06-13 DIAGNOSIS — M79621 Pain in right upper arm: Secondary | ICD-10-CM | POA: Diagnosis not present

## 2020-06-13 DIAGNOSIS — M542 Cervicalgia: Secondary | ICD-10-CM | POA: Diagnosis not present

## 2020-06-13 DIAGNOSIS — R531 Weakness: Secondary | ICD-10-CM | POA: Diagnosis not present

## 2020-06-26 ENCOUNTER — Other Ambulatory Visit: Payer: Self-pay | Admitting: Family Medicine

## 2020-07-15 ENCOUNTER — Other Ambulatory Visit: Payer: Self-pay | Admitting: Family Medicine

## 2020-07-15 DIAGNOSIS — I10 Essential (primary) hypertension: Secondary | ICD-10-CM

## 2020-07-15 NOTE — Telephone Encounter (Signed)
Please call and have pt schedule appt for bp and medication refills

## 2020-07-15 NOTE — Telephone Encounter (Signed)
Patient has been scheduled for 07/25/20. AM

## 2020-07-25 ENCOUNTER — Encounter: Payer: Self-pay | Admitting: Family Medicine

## 2020-07-25 ENCOUNTER — Other Ambulatory Visit: Payer: Self-pay

## 2020-07-25 ENCOUNTER — Ambulatory Visit: Payer: BC Managed Care – PPO | Admitting: Family Medicine

## 2020-07-25 VITALS — BP 119/75 | HR 75 | Ht 71.0 in | Wt 202.0 lb

## 2020-07-25 DIAGNOSIS — I10 Essential (primary) hypertension: Secondary | ICD-10-CM

## 2020-07-25 DIAGNOSIS — E785 Hyperlipidemia, unspecified: Secondary | ICD-10-CM | POA: Insufficient documentation

## 2020-07-25 LAB — COMPLETE METABOLIC PANEL WITH GFR
AG Ratio: 1.8 (calc) (ref 1.0–2.5)
ALT: 31 U/L (ref 9–46)
AST: 18 U/L (ref 10–35)
Albumin: 4.3 g/dL (ref 3.6–5.1)
Alkaline phosphatase (APISO): 58 U/L (ref 35–144)
BUN: 13 mg/dL (ref 7–25)
CO2: 31 mmol/L (ref 20–32)
Calcium: 9.6 mg/dL (ref 8.6–10.3)
Chloride: 105 mmol/L (ref 98–110)
Creat: 0.9 mg/dL (ref 0.70–1.25)
GFR, Est African American: 107 mL/min/{1.73_m2} (ref >=60)
GFR, Est Non African American: 93 mL/min/{1.73_m2} (ref >=60)
Globulin: 2.4 g/dL (calc) (ref 1.9–3.7)
Glucose, Bld: 100 mg/dL — ABNORMAL HIGH (ref 65–99)
Potassium: 4.6 mmol/L (ref 3.5–5.3)
Sodium: 143 mmol/L (ref 135–146)
Total Bilirubin: 0.7 mg/dL (ref 0.2–1.2)
Total Protein: 6.7 g/dL (ref 6.1–8.1)

## 2020-07-25 LAB — LIPID PANEL
Cholesterol: 160 mg/dL (ref <200)
HDL: 68 mg/dL (ref >=40)
LDL Cholesterol (Calc): 79 mg/dL (calc)
Non-HDL Cholesterol (Calc): 92 mg/dL (calc) (ref <130)
Total CHOL/HDL Ratio: 2.4 (calc) (ref <5.0)
Triglycerides: 54 mg/dL (ref <150)

## 2020-07-25 NOTE — Assessment & Plan Note (Signed)
Tolerating statin well thus far.  Due to recheck lipids and liver enzymes hopefully LDL is less than 100.  He is also made great dietary and exercise changes.

## 2020-07-25 NOTE — Assessment & Plan Note (Signed)
Well controlled. Continue current regimen. Follow up in  6 mo due for labs.  

## 2020-07-25 NOTE — Progress Notes (Signed)
Established Patient Office Visit  Subjective:  Patient ID: Ethan Bennett, male    DOB: 09-23-1960  Age: 60 y.o. MRN: [id number]  CC:  Chief Complaint  Patient presents with  . Hypertension    HPI Ethan Bennett presents for   Hypertension- Pt denies chest pain, SOB, dizziness, or heart palpitations.  Taking meds as directed w/o problems.  Denies medication side effects.    Hyperlipidemia - tolerating stating well with no myalgias or significant side effects. He is also citrus bioflavonoid as well to help reduce that.  He is exercising regularly and is really started eating more of a Mediterranean diet. Lab Results  Component Value Date   CHOL 274 (H) 02/27/2020   HDL 76 02/27/2020   LDLCALC 177 (H) 02/27/2020   TRIG 93 02/27/2020   CHOLHDL 3.6 02/27/2020      Past Medical History:  Diagnosis Date  . Hyperlipidemia   . Hypertension   . Varicocele     Past Surgical History:  Procedure Laterality Date  . BACK SURGERY    . bilateral groin hernia    . left foot surgery   2018   bone spur   . radical mastoidectomy    . reconstructive ear surgery      Family History  Problem Relation Age of Onset  . Hypertension Mother   . Cancer - Other Father        biliary    Social History   Socioeconomic History  . Marital status: Married    Spouse name: Not on file  . Number of children: Not on file  . Years of education: Not on file  . Highest education level: Not on file  Occupational History  . Not on file  Tobacco Use  . Smoking status: Former Smoker    Quit date: 01/07/1981    Years since quitting: 39.5  . Smokeless tobacco: Former Engineer, water and Sexual Activity  . Alcohol use: Yes    Comment: 8-10 drinks a week   . Drug use: No  . Sexual activity: Yes  Other Topics Concern  . Not on file  Social History Narrative  . Not on file   Social Determinants of Health   Financial Resource Strain: Not on file  Food Insecurity: Not on file   Transportation Needs: Not on file  Physical Activity: Not on file  Stress: Not on file  Social Connections: Not on file  Intimate Partner Violence: Not on file    Outpatient Medications Prior to Visit  Medication Sig Dispense Refill  . atorvastatin (LIPITOR) 40 MG tablet Take 1 tablet (40 mg total) by mouth at bedtime. 90 tablet 3  . azelastine (ASTELIN) 0.1 % nasal spray Place 2 sprays into both nostrils 2 (two) times daily. Use in each nostril as directed 30 mL 11  . finasteride (PROSCAR) 5 MG tablet TAKE 1 TABLET(5 MG) BY MOUTH DAILY 60 tablet 0  . lisinopril (ZESTRIL) 40 MG tablet TAKE 1 TABLET(40 MG) BY MOUTH DAILY 90 tablet 0  . RESTASIS 0.05 % ophthalmic emulsion     . betamethasone, augmented, (DIPROLENE) 0.05 % lotion 1 application a thin film to affected area once a day as needed during winter externally 14 days 60 mL 0  . lansoprazole (PREVACID) 30 MG capsule TAKE ONE CAPSULE BY MOUTH DAILY AT 12:00 PM NOON 90 capsule 3   No facility-administered medications prior to visit.    No Known Allergies  ROS Review of Systems  Objective:    Physical Exam Constitutional:      Appearance: He is well-developed.  HENT:     Head: Normocephalic and atraumatic.  Cardiovascular:     Rate and Rhythm: Normal rate and regular rhythm.     Heart sounds: Normal heart sounds.  Pulmonary:     Effort: Pulmonary effort is normal.     Breath sounds: Normal breath sounds.  Skin:    General: Skin is warm and dry.  Neurological:     Mental Status: He is alert and oriented to person, place, and time.  Psychiatric:        Behavior: Behavior normal.     BP 119/75   Pulse 75   Ht 5\' 11"  (1.803 m)   Wt 202 lb (91.6 kg)   SpO2 100%   BMI 28.17 kg/m  Wt Readings from Last 3 Encounters:  07/25/20 202 lb (91.6 kg)  08/08/18 205 lb (93 kg)  03/28/18 206 lb (93.4 kg)     Health Maintenance Due  Topic Date Due  . COLONOSCOPY (Pts 45-60yrs Insurance coverage will need to be  confirmed)  Never done  . COVID-19 Vaccine (3 - Booster for Moderna series) 09/16/2019    There are no preventive care reminders to display for this patient.  No results found for: TSH Lab Results  Component Value Date   WBC 5.2 01/12/2017   HGB 14.8 01/12/2017   HCT 43.1 01/12/2017   MCV 90.9 01/12/2017   PLT 231 01/12/2017   Lab Results  Component Value Date   NA 139 02/27/2020   K 4.5 02/27/2020   CO2 31 02/27/2020   GLUCOSE 102 (H) 02/27/2020   BUN 16 02/27/2020   CREATININE 0.94 02/27/2020   BILITOT 0.5 02/27/2020   ALKPHOS 66 06/12/2015   AST 19 02/27/2020   ALT 24 02/27/2020   PROT 6.9 02/27/2020   CALCIUM 9.7 02/27/2020   Lab Results  Component Value Date   CHOL 274 (H) 02/27/2020   Lab Results  Component Value Date   HDL 76 02/27/2020   Lab Results  Component Value Date   LDLCALC 177 (H) 02/27/2020   Lab Results  Component Value Date   TRIG 93 02/27/2020   Lab Results  Component Value Date   CHOLHDL 3.6 02/27/2020   Lab Results  Component Value Date   HGBA1C 5.4 03/28/2018      Assessment & Plan:   Problem List Items Addressed This Visit      Cardiovascular and Mediastinum   Essential hypertension    Well controlled. Continue current regimen. Follow up in  6 mo. due for labs       Relevant Orders   COMPLETE METABOLIC PANEL WITH GFR   Lipid panel     Other   Hyperlipidemia - Primary    Tolerating statin well thus far.  Due to recheck lipids and liver enzymes hopefully LDL is less than 100.  He is also made great dietary and exercise changes.      Relevant Orders   COMPLETE METABOLIC PANEL WITH GFR   Lipid panel      No orders of the defined types were placed in this encounter.   Follow-up: Return in about 6 months (around 01/25/2021) for Hypertension.    01/27/2021, MD

## 2020-08-08 ENCOUNTER — Encounter: Payer: Self-pay | Admitting: Family Medicine

## 2020-08-08 DIAGNOSIS — J309 Allergic rhinitis, unspecified: Secondary | ICD-10-CM

## 2020-08-08 MED ORDER — AZELASTINE HCL 0.1 % NA SOLN: 2.0000 | Freq: Two times a day (BID) | NASAL | 11 refills | Status: DC

## 2020-08-08 NOTE — Telephone Encounter (Signed)
Okay to fill the azelastine.  Just FYI it has gone over-the-counter so his insurance may or may not pay for it.  As far as the Restasis that should probably come from his eye doctor but if he needs me to send in a short supply until he gets in and that is fine.

## 2020-09-12 ENCOUNTER — Ambulatory Visit: Payer: BC Managed Care – PPO | Admitting: Family Medicine

## 2020-09-12 ENCOUNTER — Encounter: Payer: Self-pay | Admitting: Family Medicine

## 2020-09-12 ENCOUNTER — Other Ambulatory Visit: Payer: Self-pay

## 2020-09-12 VITALS — BP 128/85 | HR 68 | Resp 16

## 2020-09-12 DIAGNOSIS — R1033 Periumbilical pain: Secondary | ICD-10-CM | POA: Diagnosis not present

## 2020-09-12 NOTE — Progress Notes (Signed)
Acute Office Visit  Subjective:    Patient ID: Ethan Bennett, male    DOB: 1960/12/23, 60 y.o.   MRN: [id number]  Chief Complaint  Patient presents with   Abdominal Pain    HPI Patient is in today for possible umbilical hernia.  Patient reports a few nights ago he was out drinking beers and he started to have some umbilical discomfort.  He said it was about 5 out of 10 but when he pushed on it it got up to 8 out of 10.  Now just mildly sore he cannot put it to a number on the pain scale.  States at the time it was happening he thought he may have felt a little bit of a bulge and some mild blanching.  Symptoms resolved on their own and patient states he has not noticed anything abnormal.  States several of his brothers have had hernia operations within the past couple of years.  He denies any nausea, vomiting, diarrhea, fevers, abdominal pain, bulging, masses     Past Medical History:  Diagnosis Date   Hyperlipidemia    Hypertension    Varicocele     Past Surgical History:  Procedure Laterality Date   BACK SURGERY     bilateral groin hernia     left foot surgery   2018   bone spur    radical mastoidectomy     reconstructive ear surgery      Family History  Problem Relation Age of Onset   Hypertension Mother    Cancer - Other Father        biliary    Social History   Socioeconomic History   Marital status: Married    Spouse name: Not on file   Number of children: Not on file   Years of education: Not on file   Highest education level: Not on file  Occupational History   Not on file  Tobacco Use   Smoking status: Former    Types: Cigarettes    Quit date: 01/07/1981    Years since quitting: 39.7   Smokeless tobacco: Former  Substance and Sexual Activity   Alcohol use: Yes    Comment: 8-10 drinks a week    Drug use: No   Sexual activity: Yes  Other Topics Concern   Not on file  Social History Narrative   Not on file   Social Determinants of Health    Financial Resource Strain: Not on file  Food Insecurity: Not on file  Transportation Needs: Not on file  Physical Activity: Not on file  Stress: Not on file  Social Connections: Not on file  Intimate Partner Violence: Not on file    Outpatient Medications Prior to Visit  Medication Sig Dispense Refill   atorvastatin (LIPITOR) 40 MG tablet Take 1 tablet (40 mg total) by mouth at bedtime. 90 tablet 3   azelastine (ASTELIN) 0.1 % nasal spray Place 2 sprays into both nostrils 2 (two) times daily. Use in each nostril as directed 30 mL 11   finasteride (PROSCAR) 5 MG tablet TAKE 1 TABLET(5 MG) BY MOUTH DAILY 60 tablet 0   lisinopril (ZESTRIL) 40 MG tablet TAKE 1 TABLET(40 MG) BY MOUTH DAILY 90 tablet 0   RESTASIS 0.05 % ophthalmic emulsion      No facility-administered medications prior to visit.    No Known Allergies  Review of Systems Patient aware of signs/symptoms requiring further/urgent evaluation.     Objective:    Physical Exam Vitals  reviewed.  Constitutional:      Appearance: Normal appearance. He is normal weight.  HENT:     Head: Normocephalic and atraumatic.  Abdominal:     General: Abdomen is flat. Bowel sounds are normal. There is no distension.     Palpations: Abdomen is soft. There is no mass.     Tenderness: There is no abdominal tenderness. There is no guarding or rebound.     Hernia: No hernia is present.  Skin:    General: Skin is warm and dry.     Findings: No rash.  Neurological:     General: No focal deficit present.     Mental Status: He is alert and oriented to person, place, and time. Mental status is at baseline.  Psychiatric:        Mood and Affect: Mood normal.        Behavior: Behavior normal.        Thought Content: Thought content normal.        Judgment: Judgment normal.    BP 128/85   Pulse 68   Resp 16   SpO2 99%  Wt Readings from Last 3 Encounters:  07/25/20 202 lb (91.6 kg)  08/08/18 205 lb (93 kg)  03/28/18 206 lb (93.4  kg)    Health Maintenance Due  Topic Date Due   COLONOSCOPY (Pts 45-48yrs Insurance coverage will need to be confirmed)  Never done   Zoster Vaccines- Shingrix (1 of 2) Never done   COVID-19 Vaccine (3 - Booster for Moderna series) 09/16/2019    There are no preventive care reminders to display for this patient.   No results found for: TSH Lab Results  Component Value Date   WBC 5.2 01/12/2017   HGB 14.8 01/12/2017   HCT 43.1 01/12/2017   MCV 90.9 01/12/2017   PLT 231 01/12/2017   Lab Results  Component Value Date   NA 143 07/25/2020   K 4.6 07/25/2020   CO2 31 07/25/2020   GLUCOSE 100 (H) 07/25/2020   BUN 13 07/25/2020   CREATININE 0.90 07/25/2020   BILITOT 0.7 07/25/2020   ALKPHOS 66 06/12/2015   AST 18 07/25/2020   ALT 31 07/25/2020   PROT 6.7 07/25/2020   CALCIUM 9.6 07/25/2020   Lab Results  Component Value Date   CHOL 160 07/25/2020   Lab Results  Component Value Date   HDL 68 07/25/2020   Lab Results  Component Value Date   LDLCALC 79 07/25/2020   Lab Results  Component Value Date   TRIG 54 07/25/2020   Lab Results  Component Value Date   CHOLHDL 2.4 07/25/2020   Lab Results  Component Value Date   HGBA1C 5.4 03/28/2018       Assessment & Plan:   1. Umbilical pain Normal exam today and patient has not had any recurrent symptoms. Education on umbilical hernias provided. No signs of hernia today. Recommend he let us know if this happens again, if pain returns, any skin changes, abdominal pain, fevers, GI changes, etc. Could consider [place] - not interested in imaging today as symptoms have resolved. Patient aware of signs/symptoms requiring further/urgent evaluation.  Follow-up as needed.     Lollie Marrow Reola Calkins, DNP, FNP-C

## 2020-10-09 ENCOUNTER — Other Ambulatory Visit: Payer: Self-pay | Admitting: Medical-Surgical

## 2020-10-09 DIAGNOSIS — J309 Allergic rhinitis, unspecified: Secondary | ICD-10-CM

## 2020-10-09 NOTE — Telephone Encounter (Signed)
[name] pt 

## 2020-10-19 ENCOUNTER — Other Ambulatory Visit: Payer: Self-pay | Admitting: Family Medicine

## 2020-10-19 DIAGNOSIS — I10 Essential (primary) hypertension: Secondary | ICD-10-CM

## 2020-11-19 ENCOUNTER — Other Ambulatory Visit: Payer: Self-pay

## 2020-11-19 ENCOUNTER — Ambulatory Visit (INDEPENDENT_AMBULATORY_CARE_PROVIDER_SITE_OTHER): Payer: BC Managed Care – PPO

## 2020-11-19 ENCOUNTER — Encounter: Payer: Self-pay | Admitting: Medical-Surgical

## 2020-11-19 ENCOUNTER — Ambulatory Visit (INDEPENDENT_AMBULATORY_CARE_PROVIDER_SITE_OTHER): Payer: BC Managed Care – PPO | Admitting: Medical-Surgical

## 2020-11-19 VITALS — BP 113/76 | HR 68 | Resp 20 | Ht 71.0 in | Wt 209.0 lb

## 2020-11-19 DIAGNOSIS — R519 Headache, unspecified: Secondary | ICD-10-CM

## 2020-11-19 DIAGNOSIS — H539 Unspecified visual disturbance: Secondary | ICD-10-CM

## 2020-11-19 DIAGNOSIS — R4189 Other symptoms and signs involving cognitive functions and awareness: Secondary | ICD-10-CM

## 2020-11-19 DIAGNOSIS — R42 Dizziness and giddiness: Secondary | ICD-10-CM | POA: Diagnosis not present

## 2020-11-19 MED ORDER — MECLIZINE HCL 25 MG PO TABS: 25.0000 mg | ORAL_TABLET | Freq: Three times a day (TID) | ORAL | 3 refills | Status: DC | PRN

## 2020-11-19 NOTE — Progress Notes (Signed)
  HPI with pertinent ROS:   CC: Dizziness  HPI: Pleasant 60 year old male presenting today with reports of new onset headache and vertigo yesterday morning.  He woke around 5 AM with a severe headache.  Notes that he had had a few glasses of wine the night before so thought his headache was related to that.  He fell asleep again shortly after and when he woke again after 6, he had severe dizziness described as he felt his self spinning but also tumbling in different directions.  He was unable to get out of bed at that time due to the severity of his symptoms.  He finally was able to get out of bed around 730 that morning but his mind felt fuzzy and his vision was somewhat blurred.  He tried to start his workday but was unable to complete the tasks and ended up sleeping most of the day.  By yesterday evening and this morning, he was feeling fine again.  Around 12-1 PM today, he began to feel poorly again with a headache across his forehead just above his eyebrows and involving both temples.  He also notes that he feels a little bit short of breath but is unsure if this is anxiety for fearing that there may actually be something wrong causing his symptoms.  Of note, he reports having years of severe eye pain that was originally treated as dry eye.  He is using Restasis as prescribed but reports that the eye pain continues.  When it does hit it is sharp, stabbing, severe, and overwhelming.  He also notes that he has had quite a few ear issues in the past and a long history of ear procedures.  He is unable to jump into a swimming pool anymore as he gets severe vertigo when water hits his ears.  No difficulties with speech, ambulation, falls, weakness/numbness/tingling of the extremities, chest pain, or dyspnea at rest.  I reviewed the past medical history, family history, social history, surgical history, and allergies today and no changes were needed.  Please see the problem list section below in epic for  further details.   Physical exam:   General: Well Developed, well nourished, and in no acute distress.  Neuro: Alert and oriented x3, extra-ocular muscles intact, sensation grossly intact.  Grips equal bilaterally.  No facial droop noted.  Gait steady. HEENT: Normocephalic, atraumatic, pupils equal round reactive to light.  Skin: Warm and dry. Cardiac: Regular rate and rhythm, no murmurs rubs or gallops, no lower extremity edema.  Respiratory: Clear to auscultation bilaterally. Not using accessory muscles, speaking in full sentences.  Impression and Recommendations:    1. New onset of headaches after age 105 2. Dizziness 3. Vision changes 4. Brain fog Unclear etiology.  Consider BPVV, TIA, viral infection, or vestibular migraine.  Given the new onset of symptoms along with a neurological changes in vision as well as mental acuity getting a stat CT head without contrast.  Treating with meclizine 25 mg 3 times daily as needed.  Continue Tylenol or ibuprofen as needed for headaches.  COVID swab performed and sent to lab. - CT HEAD WO CONTRAST ( ); Future  Return if symptoms worsen or fail to improve. ___________________________________________ Thayer Ohm, DNP, APRN, FNP-BC Primary Care and Sports Medicine [facility]

## 2020-11-20 LAB — NOVEL CORONAVIRUS, NAA: SARS-CoV-2, NAA: NOT DETECTED

## 2020-11-20 LAB — SARS-COV-2, NAA 2 DAY TAT

## 2020-12-18 ENCOUNTER — Encounter: Payer: Self-pay | Admitting: Medical-Surgical

## 2020-12-19 MED ORDER — BETAMETHASONE DIPROPIONATE AUG 0.05 % EX LOTN: TOPICAL_LOTION | CUTANEOUS | 0 refills | Status: DC

## 2021-01-17 ENCOUNTER — Other Ambulatory Visit: Payer: Self-pay | Admitting: Family Medicine

## 2021-01-17 DIAGNOSIS — I10 Essential (primary) hypertension: Secondary | ICD-10-CM

## 2021-01-17 NOTE — Telephone Encounter (Signed)
LVM for patient to call back to get this f/u for bp & labs scheduled with Dr Linford Arnold. AM

## 2021-01-17 NOTE — Telephone Encounter (Signed)
Please call pt and get him scheduled for f/u for bp and labs.30 day supply of bp medication sent to pharmacy

## 2021-01-21 ENCOUNTER — Encounter: Payer: Self-pay | Admitting: Family Medicine

## 2021-01-21 ENCOUNTER — Ambulatory Visit (INDEPENDENT_AMBULATORY_CARE_PROVIDER_SITE_OTHER): Payer: BC Managed Care – PPO | Admitting: Family Medicine

## 2021-01-21 ENCOUNTER — Other Ambulatory Visit: Payer: Self-pay

## 2021-01-21 VITALS — BP 126/75 | HR 62 | Ht 71.0 in | Wt 209.0 lb

## 2021-01-21 DIAGNOSIS — I10 Essential (primary) hypertension: Secondary | ICD-10-CM | POA: Diagnosis not present

## 2021-01-21 DIAGNOSIS — M71349 Other bursal cyst, unspecified hand: Secondary | ICD-10-CM

## 2021-01-21 DIAGNOSIS — E785 Hyperlipidemia, unspecified: Secondary | ICD-10-CM | POA: Diagnosis not present

## 2021-01-21 MED ORDER — ATORVASTATIN CALCIUM 40 MG PO TABS: 40.0000 mg | ORAL_TABLET | Freq: Every day | ORAL | 3 refills | Status: DC

## 2021-01-21 NOTE — Assessment & Plan Note (Signed)
Last lipid level looked fantastic in May he had over 50% reduction in LDL with the addition of a statin.

## 2021-01-21 NOTE — Assessment & Plan Note (Signed)
Well controlled. Continue current regimen. Follow up in  6 mo  

## 2021-01-21 NOTE — Progress Notes (Addendum)
Established Patient Office Visit  Subjective:  Patient ID: Ethan Bennett, male    DOB: 04/21/1960  Age: 60 y.o. MRN: [id number]  CC:  Chief Complaint  Patient presents with   Hypertension    HPI [name] presents for 6  mo f/u.   Hypertension- Pt denies chest pain, SOB, dizziness, or heart palpitations.  Taking meds as directed w/o problems.  Denies medication side effects.    Hyperlipidemia - tolerating stating well with no myalgias or significant side effects.  Lab Results  Component Value Date   CHOL 160 07/25/2020   HDL 68 07/25/2020   LDLCALC 79 07/25/2020   TRIG 54 07/25/2020   CHOLHDL 2.4 07/25/2020   He also has a cyst on the DIP joint of his index finger on his right hand that he would like me to take a look at today.  It is really not that bothersome or painful.  Does follow yearly with [name] for eye exams.    Past Medical History:  Diagnosis Date   Hyperlipidemia    Hypertension    Varicocele     Past Surgical History:  Procedure Laterality Date   BACK SURGERY     bilateral groin hernia     left foot surgery   2018   bone spur    radical mastoidectomy     reconstructive ear surgery      Family History  Problem Relation Age of Onset   Hypertension Mother    Cancer - Other Father        biliary    Social History   Socioeconomic History   Marital status: Married    Spouse name: Not on file   Number of children: Not on file   Years of education: Not on file   Highest education level: Not on file  Occupational History   Not on file  Tobacco Use   Smoking status: Former    Types: Cigarettes    Quit date: 01/07/1981    Years since quitting: 40.0   Smokeless tobacco: Former  Substance and Sexual Activity   Alcohol use: Yes    Comment: 8-10 drinks a week    Drug use: No   Sexual activity: Yes  Other Topics Concern   Not on file  Social History Narrative   Not on file   Social Determinants of Health    Financial Resource Strain: Not on file  Food Insecurity: Not on file  Transportation Needs: Not on file  Physical Activity: Not on file  Stress: Not on file  Social Connections: Not on file  Intimate Partner Violence: Not on file    Outpatient Medications Prior to Visit  Medication Sig Dispense Refill   azelastine (ASTELIN) 0.1 % nasal spray USE 2 SPRAYS IN EACH NOSTRIL TWICE DAILY AS DIRECTED 30 mL 11   betamethasone, augmented, (DIPROLENE) 0.05 % lotion 1 application a thin film to affected area once a day as needed during winter externally 14 days 60 mL 0   finasteride (PROSCAR) 5 MG tablet TAKE 1 TABLET(5 MG) BY MOUTH DAILY 60 tablet 0   lisinopril (ZESTRIL) 40 MG tablet Take 1 tablet (40 mg total) by mouth daily. 30 DAY SUPPLY GIVEN.PLEASE CALL OFFICE AND SCHEDULE APPOINTMENT FOR FUTURE REFILLS 30 tablet 0   RESTASIS 0.05 % ophthalmic emulsion      atorvastatin (LIPITOR) 40 MG tablet Take 40 mg by mouth at bedtime.  3   meclizine (ANTIVERT) 25 MG tablet Take 1  tablet (25 mg total) by mouth 3 (three) times daily as needed for dizziness or nausea. 30 tablet 3   No facility-administered medications prior to visit.    No Known Allergies  ROS Review of Systems    Objective:    Physical Exam Constitutional:      Appearance: Normal appearance. He is well-developed.  HENT:     Head: Normocephalic and atraumatic.  Cardiovascular:     Rate and Rhythm: Normal rate and regular rhythm.     Heart sounds: Normal heart sounds.  Pulmonary:     Effort: Pulmonary effort is normal.     Breath sounds: Normal breath sounds.  Musculoskeletal:     Comments: Synovial cyst on the dorsum of the DIP joint of the index finger on the right hand.  Skin:    General: Skin is warm and dry.  Neurological:     Mental Status: He is alert and oriented to person, place, and time. Mental status is at baseline.  Psychiatric:        Behavior: Behavior normal.    BP 126/75   Pulse 62   Ht 5\' 11"   (1.803 m)   Wt 209 lb (94.8 kg)   SpO2 100%   BMI 29.15 kg/m  Wt Readings from Last 3 Encounters:  01/21/21 209 lb (94.8 kg)  11/19/20 209 lb (94.8 kg)  07/25/20 202 lb (91.6 kg)     Health Maintenance Due  Topic Date Due   COLONOSCOPY (Pts 45-42yrs Insurance coverage will need to be confirmed)  Never done    There are no preventive care reminders to display for this patient.  No results found for: TSH Lab Results  Component Value Date   WBC 5.2 01/12/2017   HGB 14.8 01/12/2017   HCT 43.1 01/12/2017   MCV 90.9 01/12/2017   PLT 231 01/12/2017   Lab Results  Component Value Date   NA 143 07/25/2020   K 4.6 07/25/2020   CO2 31 07/25/2020   GLUCOSE 100 (H) 07/25/2020   BUN 13 07/25/2020   CREATININE 0.90 07/25/2020   BILITOT 0.7 07/25/2020   ALKPHOS 66 06/12/2015   AST 18 07/25/2020   ALT 31 07/25/2020   PROT 6.7 07/25/2020   CALCIUM 9.6 07/25/2020   Lab Results  Component Value Date   CHOL 160 07/25/2020   Lab Results  Component Value Date   HDL 68 07/25/2020   Lab Results  Component Value Date   LDLCALC 79 07/25/2020   Lab Results  Component Value Date   TRIG 54 07/25/2020   Lab Results  Component Value Date   CHOLHDL 2.4 07/25/2020   Lab Results  Component Value Date   HGBA1C 5.4 03/28/2018      Assessment & Plan:   Problem List Items Addressed This Visit       Cardiovascular and Mediastinum   Essential hypertension - Primary    Well controlled. Continue current regimen. Follow up in  6 mo       Relevant Medications   atorvastatin (LIPITOR) 40 MG tablet     Other   Hyperlipidemia    Last lipid level looked fantastic in May he had over 50% reduction in LDL with the addition of a statin.      Relevant Medications   atorvastatin (LIPITOR) 40 MG tablet   Other Visit Diagnoses     Synovial cyst of hand          Synovial cyst of the finger-discussed benign nature of these  lesions.  Certainly if they become bothersome they can be  drained but they can recur even after being drained.  Were happy to schedule with our sports med doc for this if he would like to proceed.  Or can just continue to monitor.   Meds ordered this encounter  Medications   atorvastatin (LIPITOR) 40 MG tablet    Sig: Take 1 tablet (40 mg total) by mouth at bedtime.    Dispense:  90 tablet    Refill:  3     Follow-up: No follow-ups on file.    Nani Gasser, MD

## 2021-02-06 DIAGNOSIS — Z9889 Other specified postprocedural states: Secondary | ICD-10-CM | POA: Insufficient documentation

## 2021-02-06 DIAGNOSIS — H7012 Chronic mastoiditis, left ear: Secondary | ICD-10-CM | POA: Insufficient documentation

## 2021-02-06 DIAGNOSIS — J9811 Atelectasis: Secondary | ICD-10-CM | POA: Diagnosis not present

## 2021-02-06 DIAGNOSIS — H95192 Other disorders following mastoidectomy, left ear: Secondary | ICD-10-CM | POA: Diagnosis not present

## 2021-02-06 DIAGNOSIS — H73892 Other specified disorders of tympanic membrane, left ear: Secondary | ICD-10-CM | POA: Diagnosis not present

## 2021-02-10 ENCOUNTER — Encounter: Payer: Self-pay | Admitting: Family Medicine

## 2021-02-21 ENCOUNTER — Other Ambulatory Visit: Payer: Self-pay | Admitting: Family Medicine

## 2021-02-21 DIAGNOSIS — E785 Hyperlipidemia, unspecified: Secondary | ICD-10-CM

## 2021-03-11 ENCOUNTER — Ambulatory Visit (INDEPENDENT_AMBULATORY_CARE_PROVIDER_SITE_OTHER): Payer: BC Managed Care – PPO | Admitting: Family Medicine

## 2021-03-11 ENCOUNTER — Encounter: Payer: Self-pay | Admitting: Family Medicine

## 2021-03-11 ENCOUNTER — Other Ambulatory Visit: Payer: Self-pay

## 2021-03-11 ENCOUNTER — Encounter: Payer: Self-pay | Admitting: Professional

## 2021-03-11 ENCOUNTER — Ambulatory Visit (INDEPENDENT_AMBULATORY_CARE_PROVIDER_SITE_OTHER): Payer: BC Managed Care – PPO | Admitting: Professional

## 2021-03-11 VITALS — BP 132/84 | HR 68 | Resp 16 | Ht 71.0 in | Wt 216.0 lb

## 2021-03-11 DIAGNOSIS — F4323 Adjustment disorder with mixed anxiety and depressed mood: Secondary | ICD-10-CM | POA: Diagnosis not present

## 2021-03-11 DIAGNOSIS — Z0000 Encounter for general adult medical examination without abnormal findings: Secondary | ICD-10-CM | POA: Diagnosis not present

## 2021-03-11 DIAGNOSIS — Z125 Encounter for screening for malignant neoplasm of prostate: Secondary | ICD-10-CM

## 2021-03-11 DIAGNOSIS — I10 Essential (primary) hypertension: Secondary | ICD-10-CM

## 2021-03-11 MED ORDER — LISINOPRIL 40 MG PO TABS: 40.0000 mg | ORAL_TABLET | Freq: Every day | ORAL | 1 refills | Status: DC

## 2021-03-11 NOTE — Progress Notes (Signed)
[facility] Behavioral Health Counselor Initial Adult Exam  Name: Ethan Bennett Date: 03/11/2021 MRN: [id number] DOB: 1960-09-09 PCP: Ethan Games, MD  Time spent: 62 minutes 1-202 pm  Guardian/Payee:  self  Paperwork requested: No   Reason for Visit /Presenting Problem: was depressed but unsure why; reports he has felt great in the past several weeks since making the appointment.  Mental Status Exam: Appearance:   Well Groomed     Behavior:  Appropriate and Sharing  Motor:  Normal  Speech/Language:   Clear and Coherent and Normal Rate  Affect:  Full Range  Mood:  normal  Thought process:  goal directed  Thought content:    WNL  Sensory/Perceptual disturbances:    WNL  Orientation:  oriented to person, place, time/date, and situation  Attention:  Good  Concentration:  Good  Memory:  WNL  Fund of knowledge:   Good  Insight:    Good  Judgment:   Good  Impulse Control:  Good   Risk Assessment: Danger to Self:  No Self-injurious Behavior: No Danger to Others: No Duty to Warn:no Physical Aggression / Violence:No  Access to Firearms a concern: No  Gang Involvement:No  Patient / guardian was educated about steps to take if suicide or homicide risk level increases between visits: n/a While future psychiatric events cannot be accurately predicted, the patient does not currently require acute inpatient psychiatric care and does not currently meet [facility] involuntary commitment criteria.  Substance Abuse History: Current substance abuse: No   drinks glass of wine while preparing dinner  Past Psychiatric History:   Previous psychological history is significant for adjustment disorder with mixed anxiety and depressed mood Outpatient Providers:Ethan Bennett, [facility] History of Psych Hospitalization: No  Psychological Testing:  none    Abuse History:  Victim of: No.,    Report needed: No. Victim of Neglect:No. Perpetrator of  none   Witness / Exposure to  Domestic Violence: No   Protective Services Involvement: No  Witness to [employer] Violence:  No   Family History:  Family History  Problem Relation Age of Onset   Hypertension Mother    Cancer - Other Father        biliary   Hypertension Sister    Hypertension Brother    Hypertension Brother    Melanoma Brother     Living situation: the patient lives with their spouse  Sexual Orientation: Straight  Relationship Status: married  Name of spouse / other:Ethan Bennett If a parent, number of children / ages: three, Ethan Bennett 29 and married, Ethan Bennett (formerly Medical sales representative) 26 and in a relationship, and Ethan Bennett 20  Support Systems: spouse but problematic because he cannot tell her anything that will cause her anxiety so he feels alone in his thoughts and feelings much of the time  Financial Stress:  No   Income/Employment/Disability: Employment  Financial planner: No   Educational History: Education: some college  Religion/Sprituality/World View: Believes in God  Any cultural differences that may affect / interfere with treatment:  not applicable   Recreation/Hobbies: music, cooking  Stressors: Other: managing wife's anxiety,  finding his purpose    Strengths: Supportive Relationships, Family, [employer], Hopefulness, and Able to Communicate Effectively  Barriers:  internal-finding his purpose beyond making money; external-wife unwilling to address her anxiety   Legal History: Pending legal issue / charges: The patient has no significant history of legal issues. History of legal issue / charges:  none since adolescence  Medical History/Surgical History: reviewed Past Medical History:  Diagnosis Date   Hyperlipidemia    Hypertension    Varicocele     Past Surgical History:  Procedure Laterality Date   BACK SURGERY     bilateral groin hernia     left foot surgery   2018   bone spur    radical mastoidectomy     reconstructive ear surgery      Medications: Current  Outpatient Medications  Medication Sig Dispense Refill   atorvastatin (LIPITOR) 40 MG tablet Take 1 tablet (40 mg total) by mouth at bedtime. 90 tablet 3   azelastine (ASTELIN) 0.1 % nasal spray USE 2 SPRAYS IN EACH NOSTRIL TWICE DAILY AS DIRECTED 30 mL 11   betamethasone, augmented, (DIPROLENE) 0.05 % lotion 1 application a thin film to affected area once a day as needed during winter externally 14 days 60 mL 0   finasteride (PROSCAR) 5 MG tablet TAKE 1 TABLET(5 MG) BY MOUTH DAILY 60 tablet 0   lisinopril (ZESTRIL) 40 MG tablet Take 1 tablet (40 mg total) by mouth daily. 90 tablet 1   RESTASIS 0.05 % ophthalmic emulsion      No current facility-administered medications for this visit.    No Known Allergies  Diagnoses:  Adjustment disorder with mixed anxiety and depressed mood  Plan of Care:  -begin investigating future opportunities to marry business and non-profit -talk with wife about her need to address her anxiety issues -planning for holidays now  -patient to decide if he wants to meet next week or call as needed.  Teofilo Pod, [facility]

## 2021-03-11 NOTE — Progress Notes (Deleted)
[place] Counselor/Therapist Progress Note  Patient ID: Ethan Bennett, MRN: [id number],    Date: 03/11/2021  Time Spent: 45 minutes 1- pm  Treatment Type: Individual Therapy  Reported Symptoms:   Mental Status Exam: Appearance:  {PSY:22683}     Behavior: {PSY:21022743}  Motor: {PSY:22302}  Speech/Language:  {PSY:22685}  Affect: {PSY:22687}  Mood: {PSY:31886}  Thought process: {PSY:31888}  Thought content:   {PSY:7798083456}  Sensory/Perceptual disturbances:   {PSY:579-073-4017}  Orientation: {PSY:30297}  Attention: {PSY:22877}  Concentration: {PSY:217-037-0175}  Memory: {PSY:347-192-9982}  Fund of knowledge:  {PSY:217-037-0175}  Insight:   {PSY:217-037-0175}  Judgment:  {PSY:217-037-0175}  Impulse Control: {PSY:217-037-0175}   Risk Assessment: Danger to Self:  {PSY:22692} Self-injurious Behavior: {PSY:22692} Danger to Others: {PSY:22692} Duty to Warn:{PSY:311194} Physical Aggression / Violence:{PSY:21197} Access to Firearms a concern: {PSY:21197} Gang Involvement:{PSY:21197}  Subjective: This session was held via video teletherapy due to the coronavirus risk at this time. The patient consented to video teletherapy and was located at his home during this session. He is aware it is the responsibility of the patient to secure confidentiality on her end of the session. The provider was in a private home office for the duration of this session.   Issues addressed: 1-   Interventions: {[phone]}  Diagnosis:No diagnosis found.  Plan: ***  Francie Massing, [facility]

## 2021-03-11 NOTE — Progress Notes (Signed)
CPE  Subjective:  Patient ID: Ethan Bennett, male    DOB: 1961-02-07  Age: 61 y.o. MRN: [id number]  CC:  Chief Complaint  Patient presents with   Annual Exam    Fasting     HPI Ethan Bennett presents for CPE.  He is doing well overall.  Work has been busy.  He was walking for exercise little over a mile before he got COVID before the holidays.  He is planning on starting back again.  Hypertension- Taking meds as directed w/o problems.  Denies medication side effects.    Last colonoscopy around age 61 [facility].  Past Medical History:  Diagnosis Date   Hyperlipidemia    Hypertension    Varicocele     Past Surgical History:  Procedure Laterality Date   BACK SURGERY     bilateral groin hernia     left foot surgery   2018   bone spur    radical mastoidectomy     reconstructive ear surgery      Family History  Problem Relation Age of Onset   Hypertension Mother    Cancer - Other Father        biliary   Hypertension Sister    Hypertension Brother    Hypertension Brother    Melanoma Brother     Social History   Socioeconomic History   Marital status: Married    Spouse name: Not on file   Number of children: 3   Years of education: Not on file   Highest education level: Not on file  Occupational History   Occupation: Doctor, 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 Patient drinks a few cups of Coffee a day    Social Determinants of Health   Financial Resource Strain: Not on file   Food Insecurity: No Food Insecurity   Worried About Programme researcher, broadcasting/film/videounning Out of Food in the Last Year: Never true   Baristaan Out of Food in the Last Year: Never true  Transportation Needs: Not on file  Physical Activity: Not on file  Stress: Not on file  Social Connections: Not on file  Intimate Partner Violence: Not on file    Outpatient Medications Prior to Visit  Medication Sig Dispense Refill   atorvastatin (LIPITOR) 40 MG tablet Take 1 tablet (40 mg total) by mouth at bedtime. 90 tablet 3   azelastine (ASTELIN) 0.1 % nasal spray USE 2 SPRAYS IN EACH NOSTRIL TWICE DAILY AS DIRECTED 30 mL 11   betamethasone, augmented, (DIPROLENE) 0.05 % lotion 1 application a thin film to affected area once a day as needed during winter externally 14 days 60 mL 0   finasteride (PROSCAR) 5 MG tablet TAKE 1 TABLET(5 MG) BY MOUTH DAILY 60 tablet 0   RESTASIS 0.05 % ophthalmic emulsion      lisinopril (ZESTRIL) 40 MG tablet Take 1 tablet (40 mg total) by  mouth daily. 30 DAY SUPPLY GIVEN.PLEASE CALL OFFICE AND SCHEDULE APPOINTMENT FOR FUTURE REFILLS 30 tablet 0   No facility-administered medications prior to visit.    No Known Allergies  ROS Review of Systems    Objective:    Physical Exam Constitutional:      Appearance: He is well-developed.  HENT:     Head: Normocephalic and atraumatic.     Right Ear: External ear normal.     Left Ear: External ear normal.     Nose: Nose normal.  Eyes:     Conjunctiva/sclera: Conjunctivae normal.     Pupils: Pupils are equal, round, and reactive to light.  Neck:     Thyroid: No thyromegaly.  Cardiovascular:     Rate and Rhythm: Normal rate and regular rhythm.     Heart sounds: Normal heart sounds.  Pulmonary:     Effort: Pulmonary effort is normal.     Breath sounds: Normal breath sounds.  Abdominal:     General: Bowel sounds are normal. There is no distension.     Palpations: Abdomen is soft. There is no mass.     Tenderness: There is no abdominal tenderness.  There is no guarding or rebound.  Musculoskeletal:        General: Normal range of motion.     Cervical back: Normal range of motion and neck supple.  Lymphadenopathy:     Cervical: No cervical adenopathy.  Skin:    General: Skin is warm and dry.  Neurological:     Mental Status: He is alert and oriented to person, place, and time.     Deep Tendon Reflexes: Reflexes are normal and symmetric.  Psychiatric:        Behavior: Behavior normal.        Thought Content: Thought content normal.        Judgment: Judgment normal.    BP 132/84    Pulse 68    Resp 16    Ht 5\' 11"  (1.803 m)    Wt 216 lb (98 kg)    SpO2 99%    BMI 30.13 kg/m  Wt Readings from Last 3 Encounters:  03/11/21 216 lb (98 kg)  01/21/21 209 lb (94.8 kg)  11/19/20 209 lb (94.8 kg)     Health Maintenance Due  Topic Date Due   COLONOSCOPY (Pts 45-67yrs Insurance coverage will need to be confirmed)  Never done   COVID-19 Vaccine (4 - Booster for Moderna series) 02/25/2021    There are no preventive care reminders to display for this patient.  No results found for: TSH Lab Results  Component Value Date   WBC 5.2 01/12/2017   HGB 14.8 01/12/2017   HCT 43.1 01/12/2017   MCV 90.9 01/12/2017   PLT 231 01/12/2017   Lab Results  Component Value Date   NA 143 07/25/2020   K 4.6 07/25/2020   CO2 31 07/25/2020   GLUCOSE 100 (H) 07/25/2020   BUN 13 07/25/2020   CREATININE 0.90 07/25/2020   BILITOT 0.7 07/25/2020   ALKPHOS 66 06/12/2015   AST 18 07/25/2020   ALT 31 07/25/2020   PROT 6.7 07/25/2020   CALCIUM 9.6 07/25/2020   Lab Results  Component Value Date   CHOL 160 07/25/2020   Lab Results  Component Value Date   HDL 68 07/25/2020   Lab Results  Component Value Date   LDLCALC 79 07/25/2020   Lab Results  Component Value Date   TRIG 54 07/25/2020   Lab Results  Component Value Date   CHOLHDL 2.4 07/25/2020   Lab Results  Component Value Date   HGBA1C 5.4 03/28/2018      Assessment &  Plan:   Problem List Items Addressed This Visit       Cardiovascular and Mediastinum   Essential hypertension   Relevant Medications   lisinopril (ZESTRIL) 40 MG tablet   Other Visit Diagnoses     Wellness examination    -  Primary   Relevant Orders   PSA   BASIC METABOLIC PANEL WITH GFR   CBC   Screening for prostate cancer       Relevant Orders   PSA      Keep up a regular exercise program and make sure you are eating a healthy diet Try to eat 4 servings of dairy a day, or if you are lactose intolerant take a calcium with vitamin D daily.  Your vaccines are up to date.  Due for some additional labs today.  Lipid is up-to-date so we will hold off on getting that today. He did have a colonoscopy around age 65 so we will see if we can track that down to get that abstracted.  Meds ordered this encounter  Medications   lisinopril (ZESTRIL) 40 MG tablet    Sig: Take 1 tablet (40 mg total) by mouth daily.    Dispense:  90 tablet    Refill:  1    Follow-up: No follow-ups on file.    Nani Gasser, MD

## 2021-03-11 NOTE — Progress Notes (Signed)
Patient will schedule Colonoscopy appointment. Release form signed

## 2021-03-12 LAB — BASIC METABOLIC PANEL WITH GFR
BUN: 17 mg/dL (ref 7–25)
CO2: 30 mmol/L (ref 20–32)
Calcium: 9.4 mg/dL (ref 8.6–10.3)
Chloride: 103 mmol/L (ref 98–110)
Creat: 0.85 mg/dL (ref 0.70–1.35)
Glucose, Bld: 93 mg/dL (ref 65–99)
Potassium: 4.7 mmol/L (ref 3.5–5.3)
Sodium: 140 mmol/L (ref 135–146)
eGFR: 99 mL/min/{1.73_m2} (ref >=60)

## 2021-03-12 LAB — CBC
HCT: 44.3 % (ref 38.5–50.0)
Hemoglobin: 14.8 g/dL (ref 13.2–17.1)
MCH: 31.2 pg (ref 27.0–33.0)
MCHC: 33.4 g/dL (ref 32.0–36.0)
MCV: 93.3 fL (ref 80.0–100.0)
MPV: 14 fL — ABNORMAL HIGH (ref 7.5–12.5)
Platelets: 180 10*3/uL (ref 140–400)
RBC: 4.75 10*6/uL (ref 4.20–5.80)
RDW: 13 % (ref 11.0–15.0)
WBC: 5 10*3/uL (ref 3.8–10.8)

## 2021-03-12 LAB — PSA: PSA: 0.1 ng/mL (ref <=4.00)

## 2021-03-12 NOTE — Progress Notes (Signed)
Hi Ethan Bennett, prostate test is normal.  Metabolic panel looks great.  Hemoglobin and blood count are normal.

## 2021-03-18 ENCOUNTER — Ambulatory Visit: Payer: BC Managed Care – PPO | Admitting: Professional

## 2021-03-20 ENCOUNTER — Telehealth: Payer: Self-pay | Admitting: Family Medicine

## 2021-03-20 NOTE — Telephone Encounter (Signed)
Called patient to inform him we need him to come in to office and sign a MRRF. Patient stated he would come in as soon as possible - lmr.

## 2021-03-29 ENCOUNTER — Encounter: Payer: Self-pay | Admitting: Family Medicine

## 2021-03-31 NOTE — Telephone Encounter (Signed)
Thurston Hole,  Any thoughts on this?  I tried to look on up-to-date as well as Hippocrates and could not find anything specific as far as the contraindication.

## 2021-04-22 ENCOUNTER — Other Ambulatory Visit: Payer: Self-pay

## 2021-04-22 ENCOUNTER — Ambulatory Visit (HOSPITAL_COMMUNITY)
Admission: RE | Admit: 2021-04-22 | Discharge: 2021-04-22 | Disposition: A | Discharge status: Home / Self Care | Payer: BC Managed Care – PPO | Source: Ambulatory Visit | Attending: Vascular Surgery | Admitting: Vascular Surgery

## 2021-04-22 DIAGNOSIS — I872 Venous insufficiency (chronic) (peripheral): Secondary | ICD-10-CM | POA: Insufficient documentation

## 2021-04-24 ENCOUNTER — Ambulatory Visit: Payer: BC Managed Care – PPO | Admitting: Vascular Surgery

## 2021-04-24 ENCOUNTER — Other Ambulatory Visit: Payer: Self-pay

## 2021-04-24 ENCOUNTER — Encounter: Payer: Self-pay | Admitting: Vascular Surgery

## 2021-04-24 VITALS — BP 132/90 | HR 70 | Temp 98.0°F | Resp 18 | Ht 71.0 in | Wt 210.0 lb

## 2021-04-24 DIAGNOSIS — I872 Venous insufficiency (chronic) (peripheral): Secondary | ICD-10-CM | POA: Diagnosis not present

## 2021-04-24 DIAGNOSIS — M7989 Other specified soft tissue disorders: Secondary | ICD-10-CM

## 2021-04-24 NOTE — Progress Notes (Signed)
Patient ID: Ethan Bennett, male   DOB: October 02, 1960, 61 y.o.   MRN: [id number]  Reason for Consult: New Patient (Initial Visit) (Bilateral lower extremity varicose veins)   Referred by Agapito Games, *  Subjective:     HPI:  Ethan Bennett is a 61 y.o. male with history of bilateral lower extremity varicose veins and swelling in his legs.  He states that recently these have become more painful.  He also has significant itching particularly of the right leg.  He states that the discomfort overlying the varicose veins is worse on the right than the left.  Denies any history of DVT.  He has never had any venous intervention.  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  He is unaware that his parents had any varicose veins.  He has never worn compression stockings.  Past Medical History:  Diagnosis Date   Hyperlipidemia    Hypertension    Varicocele    Family History  Problem Relation Age of Onset   Hypertension Mother    Cancer - Other Father        biliary   Hypertension Sister    Hypertension Brother    Hypertension Brother    Melanoma Brother    Past Surgical History:  Procedure Laterality Date   BACK SURGERY     bilateral groin hernia     left foot surgery   2018   bone spur    radical mastoidectomy     reconstructive ear surgery      Short Social History:  Social History   Tobacco Use   Smoking status: Former    Types: Cigarettes    Quit date: 01/07/1981    Years since quitting: 40.3   Smokeless tobacco: Former  Substance Use Topics   Alcohol use: Yes    Alcohol/week: 6.0 standard drinks    Types: 2 Glasses of wine, 2 Cans of beer, 2 Shots of liquor per week    Comment: 7-8 drinks a week of beer/wine or liquor    No Known Allergies  Current Outpatient Medications  Medication Sig Dispense Refill   atorvastatin (LIPITOR) 40 MG tablet Take 1 tablet (40 mg total) by mouth at bedtime. 90  tablet 3   azelastine (ASTELIN) 0.1 % nasal spray USE 2 SPRAYS IN EACH NOSTRIL TWICE DAILY AS DIRECTED 30 mL 11   betamethasone, augmented, (DIPROLENE) 0.05 % lotion 1 application a thin film to affected area once a day as needed during winter externally 14 days 60 mL 0   finasteride (PROSCAR) 5 MG tablet TAKE 1 TABLET(5 MG) BY MOUTH DAILY 60 tablet 0   lisinopril (ZESTRIL) 40 MG tablet Take 1 tablet (40 mg total) by mouth daily. 90 tablet 1   RESTASIS 0.05 % ophthalmic emulsion      No current facility-administered medications for this visit.    Review of Systems  Constitutional:  Constitutional negative. HENT: HENT negative.  Eyes: Eyes negative.  Respiratory: Respiratory negative.  Cardiovascular: Positive for leg swelling.  GI: Gastrointestinal negative.  Musculoskeletal: Positive for leg pain.  Neurological: Neurological negative. Hematologic: Hematologic/lymphatic negative.  Psychiatric: Psychiatric negative.       Objective:  Objective   Vitals:   04/24/21 0817  BP: 132/90  Pulse: 70  Resp: 18  Temp: 98 F (36.7 C)  TempSrc: Temporal  SpO2: 100%  Weight: 210 lb (95.3 kg)  Height: 5\' 11"  (1.803  m)   Body mass index is 29.29 kg/m.  Physical Exam HENT:     Head: Normocephalic.     Nose:     Comments: Wearing a mask Eyes:     Pupils: Pupils are equal, round, and reactive to light.  Cardiovascular:     Rate and Rhythm: Normal rate.     Pulses: Normal pulses.  Pulmonary:     Effort: Pulmonary effort is normal.  Abdominal:     General: Abdomen is flat.     Palpations: Abdomen is soft.  Musculoskeletal:     Cervical back: Normal range of motion.     Right lower leg: Edema present.     Left lower leg: Edema present.  Skin:    General: Skin is warm and dry.     Capillary Refill: Capillary refill takes less than 2 seconds.  Neurological:     General: No focal deficit present.     Mental Status: He is alert.  Psychiatric:        Mood and Affect: Mood  normal.       Data: Venous Reflux Times  +--------------+---------+------+-----------+------------+--------+   RIGHT          Reflux No Reflux Reflux Time Diameter cms Comments                              Yes                                       +--------------+---------+------+-----------+------------+--------+   CFV                       yes    >1 second                          +--------------+---------+------+-----------+------------+--------+   FV prox                   yes    >1 second                          +--------------+---------+------+-----------+------------+--------+   FV mid         no                                                   +--------------+---------+------+-----------+------------+--------+   FV dist        no                                                   +--------------+---------+------+-----------+------------+--------+   Popliteal      no                                                   +--------------+---------+------+-----------+------------+--------+   [facility]  yes     >500 ms       1.24                +--------------+---------+------+-----------+------------+--------+   GSV prox thigh            yes     >500 ms      0.836                +--------------+---------+------+-----------+------------+--------+   GSV mid thigh             yes     >500 ms      0.384                +--------------+---------+------+-----------+------------+--------+   GSV dist thigh            yes     >500 ms      0.462                +--------------+---------+------+-----------+------------+--------+   GSV at knee               yes     >500 ms      0.379                +--------------+---------+------+-----------+------------+--------+   GSV prox calf             yes     >500 ms      0.559                +--------------+---------+------+-----------+------------+--------+   SSV Pop Fossa  no                              0.445                 +--------------+---------+------+-----------+------------+--------+   SSV prox calf             yes     >500 ms      0.196                +--------------+---------+------+-----------+------------+--------+   SSV mid calf   no                              0.265                +--------------+---------+------+-----------+------------+--------+      Summary:  Right:  - No evidence of deep vein thrombosis seen in the right lower extremity,  from the common femoral through the popliteal veins.  - No evidence of superficial venous thrombosis in the right lower  extremity.     - Deep vein reflux in the CFV, and proximal FV.  - Superficial vein reflux in the prox SSV, SFJ, and GSV.    I evaluated his leg with a ultrasound at the bedside today which demonstrates a large greater saphenous vein that can be traced all the way from the below-knee area to the saphenofemoral junction with multiple varicosities attached.     Assessment/Plan:     61 year old male with C3 venous disease bilaterally with symptomatic bilateral varicosities.  Fit for thigh-high compression stockings today.  I discussed the expected outcomes with the patient.  He will follow-up in 3 months at that time we will obtain left lower extremity venous reflux study as well given the multiple varicosities on that side.  Patient appears to be a good candidate for at least  right-sided greater saphenous vein ablation and stab phlebectomy.     Maeola Harman MD [facility]

## 2021-04-29 ENCOUNTER — Other Ambulatory Visit: Payer: Self-pay | Admitting: *Deleted

## 2021-04-29 DIAGNOSIS — I872 Venous insufficiency (chronic) (peripheral): Secondary | ICD-10-CM

## 2021-05-30 ENCOUNTER — Other Ambulatory Visit: Payer: Self-pay | Admitting: Family Medicine

## 2021-05-30 MED ORDER — BETAMETHASONE DIPROPIONATE AUG 0.05 % EX LOTN: TOPICAL_LOTION | CUTANEOUS | 0 refills | Status: DC

## 2021-06-24 ENCOUNTER — Ambulatory Visit: Payer: BC Managed Care – PPO | Admitting: Family Medicine

## 2021-06-24 ENCOUNTER — Encounter: Payer: Self-pay | Admitting: Family Medicine

## 2021-06-24 VITALS — BP 127/66 | HR 84 | Ht 71.0 in | Wt 199.0 lb

## 2021-06-24 DIAGNOSIS — R1033 Periumbilical pain: Secondary | ICD-10-CM | POA: Diagnosis not present

## 2021-06-24 DIAGNOSIS — K429 Umbilical hernia without obstruction or gangrene: Secondary | ICD-10-CM

## 2021-06-24 DIAGNOSIS — Z8249 Family history of ischemic heart disease and other diseases of the circulatory system: Secondary | ICD-10-CM | POA: Insufficient documentation

## 2021-06-24 NOTE — Assessment & Plan Note (Signed)
Discussed options including continuing to monitor it does not really bother him its only sore if he presses on it.  Versus going ahead and having a consult with the surgeon.  He wants to hold off for now he is actually getting ready to have something surgery done next month.  He says I will regroup in the fall.  If at any point it becomes more painful tender or bothersome or achy then please let me know we can go ahead and refer.  We did discuss the rare complication of an incarcerated hernia and to go to the emergency department if this happens ?

## 2021-06-24 NOTE — Assessment & Plan Note (Signed)
Think he would benefit from further evaluation for aortic aneurysm.  He himself is low risk as far as no high blood pressure etc. not a smoker.  Positive family history.  He thinks his grandmother may have also had a cerebral aneurysm that is not 100% sure about that.  We discussed that most aneurysms do not cause any discomfort or pain until they are rupturing or tearing. ?

## 2021-06-24 NOTE — Progress Notes (Signed)
? ?New Patient Office Visit ? ?Subjective   ? ?Patient ID: Ethan Bennett, male    DOB: 1960/10/04  Age: 61 y.o. MRN: [id number] ? ?CC:  ?Chief Complaint  ?Patient presents with  ? umbillical pain  ? ? ?HPI ?[name] presents to for tenderness and swelling in the umbilicus area.  Not sure of specific timeframe but has noticed it since he started doing Pilates.  He says it really does not bother him during exercise its more so just if he touches it or presses on it.  Interestingly, he had a bilateral groin hernia repair as an infant. ? ?He also wanted to let me know that his brother at age 27 was diagnosed with an aortic aneurysm.  His brother was never smoker but did have atrial fibrillation. ? ?Outpatient Encounter Medications as of 06/24/2021  ?Medication Sig  ? atorvastatin (LIPITOR) 40 MG tablet Take 1 tablet (40 mg total) by mouth at bedtime.  ? azelastine (ASTELIN) 0.1 % nasal spray USE 2 SPRAYS IN EACH NOSTRIL TWICE DAILY AS DIRECTED  ? betamethasone, augmented, (DIPROLENE) [id number] % lotion 1 application a thin film to affected area once a day as needed during winter externally 14 days  ? finasteride (PROSCAR) 5 MG tablet TAKE 1 TABLET(5 MG) BY MOUTH DAILY  ? lisinopril (ZESTRIL) 40 MG tablet Take 1 tablet (40 mg total) by mouth daily.  ? RESTASIS 0.05 % ophthalmic emulsion   ? ?No facility-administered encounter medications on file as of 06/24/2021.  ? ? ?Past Medical History:  ?Diagnosis Date  ? Hyperlipidemia   ? Hypertension   ? Varicocele   ? ? ?Past Surgical History:  ?Procedure Laterality Date  ? BACK SURGERY    ? bilateral groin hernia    ? left foot surgery   2018  ? bone spur   ? radical mastoidectomy    ? reconstructive ear surgery    ? ? ?Family History  ?Problem Relation Age of Onset  ? Hypertension Mother   ? Cancer - Other Father   ?     biliary  ? Hypertension Sister   ? Hypertension Brother   ? Hypertension Brother   ? Melanoma Brother   ? ? ?Social History  ? ?Socioeconomic History   ? Marital status: Married  ?  Spouse name: Not on file  ? Number of children: 3  ? Years of education: Not on file  ? Highest education level: Not on file  ?Occupational History  ? Occupation: Actor  ?Tobacco Use  ? Smoking status: Former  ?  Types: Cigarettes  ?  Quit date: 01/07/1981  ?  Years since quitting: 40.4  ? Smokeless tobacco: Former  ?Substance and Sexual Activity  ? Alcohol use: Yes  ?  Alcohol/week: 6.0 standard drinks  ?  Types: 2 Glasses of wine, 2 Cans of beer, 2 Shots of liquor per week  ?  Comment: 7-8 drinks a week of beer/wine or liquor  ? Drug use: No  ? Sexual activity: Yes  ?Other Topics Concern  ? Not on file  ?Social History Narrative  ? Patient walk usually a mile day. Patient drinks a few cups of Coffee a day   ? ?Social Determinants of Health  ? ?Financial Resource Strain: Not on file  ?Food Insecurity: No Food Insecurity  ? Worried About Charity fundraiser in the Last Year: Never true  ? Ran Out of Food in the Last Year: Never true  ?Transportation Needs: Not  on file  ?Physical Activity: Not on file  ?Stress: Not on file  ?Social Connections: Not on file  ?Intimate Partner Violence: Not on file  ? ? ?ROS ? ?  ? ? ?Objective   ? ?BP 127/66   Pulse 84   Ht 5\' 11"  (1.803 m)   Wt 199 lb (90.3 kg)   SpO2 98%   BMI 27.75 kg/m?  ? ?Physical Exam ?Vitals reviewed.  ?Constitutional:   ?   Appearance: He is well-developed.  ?HENT:  ?   Head: Normocephalic and atraumatic.  ?Eyes:  ?   Conjunctiva/sclera: Conjunctivae normal.  ?Cardiovascular:  ?   Rate and Rhythm: Normal rate.  ?Pulmonary:  ?   Effort: Pulmonary effort is normal.  ?Abdominal:  ?   Comments: Small herniation on the upper part of the umbilicus.  Tender to touch and exam.  No incarceration of the hernia  ?Skin: ?   General: Skin is dry.  ?   Coloration: Skin is not pale.  ?Neurological:  ?   Mental Status: He is alert and oriented to person, place, and time.  ?Psychiatric:     ?   Behavior: Behavior normal.   ? ? ? ?  ? ?Assessment & Plan:  ? ?Problem List Items Addressed This Visit   ? ?  ? Other  ? Umbilical pain  ? Umbilical hernia without obstruction and without gangrene - Primary  ?  Discussed options including continuing to monitor it does not really bother him its only sore if he presses on it.  Versus going ahead and having a consult with the surgeon.  He wants to hold off for now he is actually getting ready to have something surgery done next month.  He says I will regroup in the fall.  If at any point it becomes more painful tender or bothersome or achy then please let me know we can go ahead and refer.  We did discuss the rare complication of an incarcerated hernia and to go to the emergency department if this happens ? ?  ?  ? Family history of aortic aneurysm  ?  Think he would benefit from further evaluation for aortic aneurysm.  He himself is low risk as far as no high blood pressure etc. not a smoker.  Positive family history.  He thinks his grandmother may have also had a cerebral aneurysm that is not 100% sure about that.  We discussed that most aneurysms do not cause any discomfort or pain until they are rupturing or tearing. ? ?  ?  ? Relevant Orders  ? VAS [place] AAA DUPLEX  ? ? ?No follow-ups on file.  ? ?Beatrice Lecher, MD ? ? ?

## 2021-06-24 NOTE — Progress Notes (Signed)
Pt reports that he started doing pilates several weeks ago and he noticed that his belly button was distended and tender to touch.  ?

## 2021-06-24 NOTE — Addendum Note (Signed)
Addended by: Nani Gasser D on: 06/24/2021 05:43 PM ? ? Modules accepted: Orders ? ?

## 2021-07-01 ENCOUNTER — Other Ambulatory Visit: Payer: Self-pay | Admitting: Family Medicine

## 2021-07-01 DIAGNOSIS — Z8249 Family history of ischemic heart disease and other diseases of the circulatory system: Secondary | ICD-10-CM

## 2021-07-01 DIAGNOSIS — R1010 Upper abdominal pain, unspecified: Secondary | ICD-10-CM

## 2021-07-02 ENCOUNTER — Inpatient Hospital Stay (HOSPITAL_COMMUNITY): Admission: RE | Admit: 2021-07-02 | Payer: BC Managed Care – PPO | Source: Ambulatory Visit

## 2021-07-02 ENCOUNTER — Encounter (HOSPITAL_COMMUNITY): Payer: Self-pay

## 2021-07-08 ENCOUNTER — Other Ambulatory Visit: Payer: Self-pay

## 2021-07-08 DIAGNOSIS — I872 Venous insufficiency (chronic) (peripheral): Secondary | ICD-10-CM

## 2021-07-08 NOTE — Progress Notes (Signed)
Error

## 2021-07-10 DIAGNOSIS — K429 Umbilical hernia without obstruction or gangrene: Secondary | ICD-10-CM | POA: Diagnosis not present

## 2021-07-23 ENCOUNTER — Ambulatory Visit: Payer: BC Managed Care – PPO | Admitting: Vascular Surgery

## 2021-07-23 ENCOUNTER — Encounter: Payer: Self-pay | Admitting: Vascular Surgery

## 2021-07-23 ENCOUNTER — Ambulatory Visit (HOSPITAL_COMMUNITY)
Admission: RE | Admit: 2021-07-23 | Discharge: 2021-07-23 | Disposition: A | Discharge status: Home / Self Care | Payer: BC Managed Care – PPO | Source: Ambulatory Visit | Attending: Vascular Surgery | Admitting: Vascular Surgery

## 2021-07-23 VITALS — BP 108/69 | HR 80 | Temp 98.1°F | Resp 20 | Ht 71.0 in | Wt 195.0 lb

## 2021-07-23 DIAGNOSIS — I872 Venous insufficiency (chronic) (peripheral): Secondary | ICD-10-CM

## 2021-07-23 NOTE — Progress Notes (Signed)
Patient ID: Ethan Bennett, male   DOB: 1960/07/21, 61 y.o.   MRN: [id number]  Reason for Consult: Follow-up   Referred by Agapito Games, *  Subjective:     HPI:  Ethan Bennett is a 61 y.o. male history of bilateral lower extremity symptomatic varicose veins.  He has been compliant with thigh-high stockings since our last visit.  He is still having pain particularly in the right greater than the left and also has itching of the right greater than left overlying the varicose veins.  No history of DVT or venous intervention.  No tissue loss or ulceration does have associated swelling bilaterally.  Past Medical History:  Diagnosis Date   Hyperlipidemia    Hypertension    Varicocele    Family History  Problem Relation Age of Onset   Hypertension Mother    Cancer - Other Father        biliary   Hypertension Sister    Hypertension Brother    Hypertension Brother    Melanoma Brother    Past Surgical History:  Procedure Laterality Date   BACK SURGERY     bilateral groin hernia     left foot surgery   2018   bone spur    radical mastoidectomy     reconstructive ear surgery      Short Social History:  Social History   Tobacco Use   Smoking status: Former    Types: Cigarettes    Quit date: 01/07/1981    Years since quitting: 40.5   Smokeless tobacco: Former  Substance Use Topics   Alcohol use: Yes    Alcohol/week: 6.0 standard drinks    Types: 2 Glasses of wine, 2 Cans of beer, 2 Shots of liquor per week    Comment: 7-8 drinks a week of beer/wine or liquor    No Known Allergies  Current Outpatient Medications  Medication Sig Dispense Refill   atorvastatin (LIPITOR) 40 MG tablet Take 1 tablet (40 mg total) by mouth at bedtime. 90 tablet 3   azelastine (ASTELIN) 0.1 % nasal spray USE 2 SPRAYS IN EACH NOSTRIL TWICE DAILY AS DIRECTED 30 mL 11   betamethasone, augmented, (DIPROLENE) 0.05 % lotion 1 application a thin film to affected area once a day as  needed during winter externally 14 days 60 mL 0   finasteride (PROSCAR) 5 MG tablet TAKE 1 TABLET(5 MG) BY MOUTH DAILY 60 tablet 0   lisinopril (ZESTRIL) 40 MG tablet Take 1 tablet (40 mg total) by mouth daily. 90 tablet 1   RESTASIS 0.05 % ophthalmic emulsion      No current facility-administered medications for this visit.    Review of Systems  Constitutional:  Constitutional negative. HENT: HENT negative.  Eyes: Eyes negative.  Respiratory: Respiratory negative.  Cardiovascular: Positive for leg swelling.  GI: Gastrointestinal negative.  Musculoskeletal: Positive for leg pain.  Skin:       Itching Neurological: Neurological negative. Hematologic: Hematologic/lymphatic negative.  Psychiatric: Psychiatric negative.       Objective:  Objective   Vitals:   07/23/21 1408  BP: 108/69  Pulse: 80  Resp: 20  Temp: 98.1 F (36.7 C)  SpO2: 94%  Weight: 195 lb (88.5 kg)  Height: 5\' 11"  (1.803 m)   Body mass index is 27.2 kg/m.  Physical Exam HENT:     Head: Normocephalic.     Nose: Nose normal.  Eyes:     Pupils: Pupils are equal, round, and reactive to light.  Cardiovascular:     Rate and Rhythm: Normal rate.     Pulses: Normal pulses.  Pulmonary:     Effort: Pulmonary effort is normal.  Abdominal:     General: Abdomen is flat.  Musculoskeletal:     Cervical back: Normal range of motion.     Right lower leg: Edema present.     Left lower leg: Edema present.     Comments: Significant varicose veins bilateral medial legs as previously photo documented.  Trace bilateral edema but he has had compression stockings on  Skin:    General: Skin is warm and dry.     Capillary Refill: Capillary refill takes less than 2 seconds.  Neurological:     General: No focal deficit present.     Mental Status: He is alert.  Psychiatric:        Mood and Affect: Mood normal.    Data: LEFT          Reflux NoRefluxReflux TimeDiameter cmsComments                           Yes                                   +--------------+---------+------+-----------+------------+--------+  CFV                     yes   >1 second                       +--------------+---------+------+-----------+------------+--------+  FV prox                 yes   >1 second                       +--------------+---------+------+-----------+------------+--------+  FV mid                  yes   >1 second                       +--------------+---------+------+-----------+------------+--------+  FV dist                 yes   >1 second                       +--------------+---------+------+-----------+------------+--------+  Popliteal               yes   >1 second                       +--------------+---------+------+-----------+------------+--------+  GSV at SFJ                     >500 ms      1.32              +--------------+---------+------+-----------+------------+--------+  GSV prox thigh                 >500 ms     0.828              +--------------+---------+------+-----------+------------+--------+  GSV mid thigh                  >500 ms     0.682              +--------------+---------+------+-----------+------------+--------+  GSV dist  thigh                 >500 ms     0.907              +--------------+---------+------+-----------+------------+--------+  GSV at knee                    >500 ms     0.806              +--------------+---------+------+-----------+------------+--------+  GSV prox calf                              0.885              +--------------+---------+------+-----------+------------+--------+  GSV mid calf                               0.548              +--------------+---------+------+-----------+------------+--------+  SSV Pop Fossa no                           0.322              +--------------+---------+------+-----------+------------+--------+  SSV prox calf  no                           0.304              +--------------+---------+------+-----------+------------+--------+  SSV mid calf                               0.298              +--------------+---------+------+-----------+------------+--------+           Summary:  Left:  - No evidence of deep vein thrombosis seen in the left lower extremity,  from the common femoral through the popliteal veins.  - No evidence of superficial venous thrombosis in the left lower  extremity.  - No evidence of superficial venous reflux seen in the left short  saphenous vein.  - Venous reflux is noted in the left common femoral vein.  - Venous reflux is noted in the left sapheno-femoral junction.  - Venous reflux is noted in the left greater saphenous vein in the thigh.  - Venous reflux is noted in the left femoral vein.  - Venous reflux is noted in the left popliteal vein.  - Patient became light headed but recovered after being put in  Trendelenburg position.      Assessment/Plan:    61 year old male with bilateral greater saphenous vein reflux leading to large symptomatic varicose veins with trace edema bilaterally as well even with compression stockings that he has been compliant with.  I discussed with him the need for laser ablation as well as stab phlebectomy greater than 20 bilaterally.  We will begin with the right.  He does have a few work and travel related plans we will schedule around this in the near future     Maeola HarmanBrandon Christopher Antjuan Rothe MD [facility]

## 2021-09-15 DIAGNOSIS — M25561 Pain in right knee: Secondary | ICD-10-CM | POA: Diagnosis not present

## 2021-09-24 ENCOUNTER — Ambulatory Visit (HOSPITAL_COMMUNITY)
Admission: RE | Admit: 2021-09-24 | Payer: BC Managed Care – PPO | Source: Ambulatory Visit | Attending: Family Medicine | Admitting: Family Medicine

## 2021-09-27 ENCOUNTER — Other Ambulatory Visit: Payer: Self-pay | Admitting: Family Medicine

## 2021-09-27 DIAGNOSIS — I10 Essential (primary) hypertension: Secondary | ICD-10-CM

## 2021-10-07 DIAGNOSIS — M25561 Pain in right knee: Secondary | ICD-10-CM | POA: Diagnosis not present

## 2021-11-21 ENCOUNTER — Ambulatory Visit: Payer: BC Managed Care – PPO | Admitting: Sports Medicine

## 2021-11-21 ENCOUNTER — Encounter: Payer: Self-pay | Admitting: Sports Medicine

## 2021-11-21 DIAGNOSIS — L0231 Cutaneous abscess of buttock: Secondary | ICD-10-CM

## 2021-11-21 DIAGNOSIS — L723 Sebaceous cyst: Secondary | ICD-10-CM | POA: Insufficient documentation

## 2021-11-21 MED ORDER — DOXYCYCLINE HYCLATE 100 MG PO TABS
100.0000 mg | ORAL_TABLET | Freq: Two times a day (BID) | ORAL | 0 refills | Status: AC
Start: 2021-11-21 — End: 2021-12-01

## 2021-11-21 NOTE — Progress Notes (Signed)
    Procedures performed today:    None.  Independent interpretation of notes and tests performed by another provider:   None.  Brief History, Exam, Impression, and Recommendations:    Abscess of right buttock 1 to 2 cm nondraining buttock abscess present for a couple of weeks. He has tried expressing the contents but was not successful. On exam he does have a 1 inch subcutaneous mass with overlying erythema. Adding 7 days of doxycycline, warm compresses, we will hold off on I&D for now, return to see me in 10 days.   ____________________________________________ Gwen Her. Dianah Field, M.D., ABFM., CAQSM., AME. Primary [facility] and [facility]  Adjunct Professor of [employer]  Risk manager

## 2021-11-21 NOTE — Assessment & Plan Note (Signed)
1 to 2 cm nondraining buttock abscess present for a couple of weeks. He has tried expressing the contents but was not successful. On exam he does have a 1 inch subcutaneous mass with overlying erythema. Adding 7 days of doxycycline, warm compresses, we will hold off on I&D for now, return to see me in 10 days.

## 2021-12-01 ENCOUNTER — Encounter: Payer: Self-pay | Admitting: Sports Medicine

## 2021-12-01 ENCOUNTER — Ambulatory Visit: Payer: BC Managed Care – PPO | Admitting: Sports Medicine

## 2021-12-01 VITALS — HR 80 | Ht 71.0 in | Wt 199.0 lb

## 2021-12-01 DIAGNOSIS — E291 Testicular hypofunction: Secondary | ICD-10-CM

## 2021-12-01 DIAGNOSIS — L0231 Cutaneous abscess of buttock: Secondary | ICD-10-CM | POA: Diagnosis not present

## 2021-12-01 DIAGNOSIS — E785 Hyperlipidemia, unspecified: Secondary | ICD-10-CM | POA: Diagnosis not present

## 2021-12-01 DIAGNOSIS — F4323 Adjustment disorder with mixed anxiety and depressed mood: Secondary | ICD-10-CM

## 2021-12-01 NOTE — Assessment & Plan Note (Signed)
Ethan Bennett also has a history of adjustment disorder with depressed mood, feeling this again. His principal complaint was lack of sex drive, erectile function is adequate. He was interested in his testosterone level so I am happy to check this, looks like he is due for lipid panel as well so we will do this 2, he would like to start with behavioral therapy and if insufficient improvement after 6 to 12 weeks we will consider Wellbutrin rather than SSRI. He can follow-up with either me or his PCP for this.

## 2021-12-01 NOTE — Progress Notes (Signed)
    Procedures performed today:    None.  Independent interpretation of notes and tests performed by another provider:   None.  Brief History, Exam, Impression, and Recommendations:    Abscess of right buttock Buttock abscess has improved considerably, no longer tender but the nodule still present, he will this frequently, I do not think we need any more courses of antibiotics.  Adjustment disorder with mixed anxiety and depressed mood Ethan Bennett also has a history of adjustment disorder with depressed mood, feeling this again. His principal complaint was lack of sex drive, erectile function is adequate. He was interested in his testosterone level so I am happy to check this, looks like he is due for lipid panel as well so we will do this 2, he would like to start with behavioral therapy and if insufficient improvement after 6 to 12 weeks we will consider Wellbutrin rather than SSRI. He can follow-up with either me or his PCP for this.  I spent 30 minutes of total time managing this patient today, this includes chart review, face to face, and non-face to face time.  ____________________________________________ Gwen Her. Dianah Field, M.D., ABFM., CAQSM., AME. Primary Care and Sports Medicine [facility]  Adjunct Professor of [employer] [employer] [facility] of Medicine  Risk manager

## 2021-12-01 NOTE — Assessment & Plan Note (Signed)
Buttock abscess has improved considerably, no longer tender but the nodule still present, he will this frequently, I do not think we need any more courses of antibiotics.

## 2021-12-03 DIAGNOSIS — E785 Hyperlipidemia, unspecified: Secondary | ICD-10-CM | POA: Diagnosis not present

## 2021-12-03 DIAGNOSIS — E291 Testicular hypofunction: Secondary | ICD-10-CM | POA: Diagnosis not present

## 2021-12-05 DIAGNOSIS — K420 Umbilical hernia with obstruction, without gangrene: Secondary | ICD-10-CM | POA: Diagnosis not present

## 2021-12-07 LAB — CBC
HCT: 44.7 % (ref 38.5–50.0)
Hemoglobin: 15 g/dL (ref 13.2–17.1)
MCH: 31.4 pg (ref 27.0–33.0)
MCHC: 33.6 g/dL (ref 32.0–36.0)
MCV: 93.7 fL (ref 80.0–100.0)
MPV: 12.8 fL — ABNORMAL HIGH (ref 7.5–12.5)
Platelets: 191 10*3/uL (ref 140–400)
RBC: 4.77 10*6/uL (ref 4.20–5.80)
RDW: 12.3 % (ref 11.0–15.0)
WBC: 5.3 10*3/uL (ref 3.8–10.8)

## 2021-12-07 LAB — COMPLETE METABOLIC PANEL WITH GFR
AG Ratio: 1.9 (calc) (ref 1.0–2.5)
ALT: 20 U/L (ref 9–46)
AST: 17 U/L (ref 10–35)
Albumin: 4.2 g/dL (ref 3.6–5.1)
Alkaline phosphatase (APISO): 74 U/L (ref 35–144)
BUN: 18 mg/dL (ref 7–25)
CO2: 30 mmol/L (ref 20–32)
Calcium: 9.4 mg/dL (ref 8.6–10.3)
Chloride: 103 mmol/L (ref 98–110)
Creat: 0.96 mg/dL (ref 0.70–1.35)
Globulin: 2.2 g/dL (calc) (ref 1.9–3.7)
Glucose, Bld: 92 mg/dL (ref 65–99)
Potassium: 4.7 mmol/L (ref 3.5–5.3)
Sodium: 141 mmol/L (ref 135–146)
Total Bilirubin: 0.4 mg/dL (ref 0.2–1.2)
Total Protein: 6.4 g/dL (ref 6.1–8.1)
eGFR: 90 mL/min/{1.73_m2} (ref >=60)

## 2021-12-07 LAB — LIPID PANEL
Cholesterol: 171 mg/dL (ref <200)
HDL: 72 mg/dL (ref >=40)
LDL Cholesterol (Calc): 83 mg/dL (calc)
Non-HDL Cholesterol (Calc): 99 mg/dL (calc) (ref <130)
Total CHOL/HDL Ratio: 2.4 (calc) (ref <5.0)
Triglycerides: 79 mg/dL (ref <150)

## 2021-12-07 LAB — HEMOGLOBIN A1C
Hgb A1c MFr Bld: 5.5 % of total Hgb (ref <5.7)
Mean Plasma Glucose: 111 mg/dL
eAG (mmol/L): 6.2 mmol/L

## 2021-12-07 LAB — TESTOSTERONE, FREE & TOTAL
Free Testosterone: 84.6 pg/mL (ref 35.0–155.0)
Testosterone, Total, LC-MS-MS: 669 ng/dL (ref 250–1100)

## 2021-12-07 LAB — PSA, TOTAL AND FREE
PSA, % Free: UNDETERMINED % (calc) (ref >25)
PSA, Free: <0.1 ng/mL
PSA, Total: 0.1 ng/mL (ref <=4.0)

## 2021-12-08 ENCOUNTER — Ambulatory Visit (INDEPENDENT_AMBULATORY_CARE_PROVIDER_SITE_OTHER): Payer: BC Managed Care – PPO | Admitting: Professional

## 2021-12-08 ENCOUNTER — Encounter: Payer: Self-pay | Admitting: Professional

## 2021-12-08 DIAGNOSIS — F4323 Adjustment disorder with mixed anxiety and depressed mood: Secondary | ICD-10-CM | POA: Diagnosis not present

## 2021-12-08 NOTE — Progress Notes (Addendum)
[place] Counselor/Therapist Progress Note  Patient ID: Ethan Bennett, MRN: [id number],    Date: 12/08/2021  Time Spent: 58 minutes [id number]   Treatment Type: Individual Therapy  Risk Assessment: Danger to Self:  No Self-injurious Behavior: No Danger to Others: No  Subjective: This session was held via video teletherapy The patient consented to video teletherapy and was located at his home during this session. He is aware it is the responsibility of the patient to secure confidentiality on his end of the session. The provider was in a private home office for the duration of this session.   The patient arrived on time for his webex session.   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 Alleviate depressive symptoms and return to previous level of effective functioning. Objective Increasingly verbalize hopeful and positive statements regarding self, others, and the future. Target Date: 2022-12-08 Frequency: Biweekly  Progress: 0 Modality: individual  Related Interventions Teach the client more about depression and how to recognize and accept some sadness  as a normal variation in feeling. Objective Learn and implement relapse prevention skills. Target Date: 2022-12-08 Frequency: Biweekly  Progress: 0 Modality: individual  Related Interventions Discuss with the client the distinction between a lapse and relapse, associating a lapse with a rather common, temporary setback that may involve, for example, re-experiencing a depressive thought and/or urge to withdraw or avoid (perhaps as related to some loss or conflict) and a relapse as a sustained return to a pattern of depressive thinking and feeling usually accompanied by interpersonal withdrawal and/or avoidance. Build the client's relapse prevention skills by helping him/her identify early warning signs of relapse and rehearsing the use of skills learned during therapy to manage them. Objective Identify important people in life, past and present, and describe the quality, good and poor, of those relationships. Target Date: 2022-12-08 Frequency: Biweekly  Progress: 0 Modality: individual  Related Interventions Conduct Interpersonal Therapy (see Interpersonal Psychotherapy of Depression by Mirian Capuchin al.), beginning with the assessment of the client's "interpersonal inventory" of important past and present relationships; develop a case formulation linking depression to grief, interpersonal role disputes, role transitions, and/or interpersonal deficits). 2. Appropriately grieve the loss in order to normalize mood and to return to previously adaptive level of functioning. 3. Begin a healthy grieving process around the loss. Objective Identify how the use of substances has aided the avoidance of feelings associated with the loss. Target Date: 2022-12-08 Frequency: Biweekly  Progress: 0 Modality: individual  Related Interventions Assess the role that substance abuse has played as an escape for the client from the pain or guilt of loss. Objective Read books on the topic of grief to better understand  the loss experience  and to increase a sense of hope. Target Date: 2022-12-08 Frequency: Biweekly  Progress: 0 Modality: individual  Related Interventions Ask the client to read books on grief and loss (e.g., Getting to the Other Side of Grief: Overcoming the Loss of a Spouse by Zonnebelt-Smeenge and Joana Reamer; Good Grief by Alphia Kava; When Bad Things Happen to Good People by Pryor Montes; How Can It Be All Right When Everything Is All Wrong? by [facility] [place]); process the content. Objective Identify how avoiding dealing with loss has negatively impacted life. Target Date: 2022-12-08 Frequency: Biweekly  Progress: 0 Modality: individual  Related Interventions Ask the client to list ways that avoidance of grieving has negatively impacted his/her life. Objective Reengage in activities with family, friends, coworkers, and others. Target Date: 2022-12-08 Frequency: Biweekly  Progress: 0 Modality: individual  Related Interventions Assist the client in recommitting and reengaging in the primary social positive roles in which he/she has functioned prior to the loss. Promote behavioral activation by assisting the client in listing activities which he/she previously enjoyed but has not engaged in since experiencing the loss and then encourage reengagement in these activities (or assign "Identify and Schedule Pleasant Activities" in the Adult Psychotherapy Homework Planner by Stephannie Li). 4. Develop an awareness of how the avoidance of grieving has affected life and begin the healing process. 5. Develop healthy interpersonal relationships that lead to the alleviation and help prevent the relapse of depression. 6. Recognize, accept, and cope with feelings of depression. 7. Resolve the loss, reengaging in old relationships and initiating new contacts with others.  Diagnosis:Adjustment disorder with mixed anxiety and depressed mood  Plan:  -meet again on Monday, December 22, 2021 at 8am.

## 2021-12-10 ENCOUNTER — Ambulatory Visit: Payer: BC Managed Care – PPO | Admitting: Sports Medicine

## 2021-12-10 ENCOUNTER — Other Ambulatory Visit: Payer: Self-pay | Admitting: Sports Medicine

## 2021-12-10 ENCOUNTER — Encounter: Payer: Self-pay | Admitting: Sports Medicine

## 2021-12-10 DIAGNOSIS — L723 Sebaceous cyst: Secondary | ICD-10-CM | POA: Diagnosis not present

## 2021-12-10 DIAGNOSIS — M7989 Other specified soft tissue disorders: Secondary | ICD-10-CM | POA: Diagnosis not present

## 2021-12-10 MED ORDER — DOXYCYCLINE HYCLATE 100 MG PO TABS
100.0000 mg | ORAL_TABLET | Freq: Two times a day (BID) | ORAL | 0 refills | Status: AC
Start: 2021-12-10 — End: 2021-12-17

## 2021-12-10 MED ORDER — TRAMADOL HCL 50 MG PO TABS
50.0000 mg | ORAL_TABLET | Freq: Three times a day (TID) | ORAL | 0 refills | Status: DC | PRN
Start: 2021-12-10 — End: 2022-02-02

## 2021-12-10 NOTE — Patient Instructions (Signed)
Incision Care, Adult An incision is a surgical cut that is made through your skin. Most incisions are closed after a surgical procedure. Your incision may be closed with stitches (sutures), staples, skin glue, or adhesive strips. You may need to return to your health care provider to have sutures or staples removed. This may occur several days or several weeks after your surgery. Until then, the incision needs to be cared for properly to prevent infection. Follow instructions from your health care provider about how to care for your incision. Supplies needed: Soap, water, and a clean hand towel. Wound cleanser. A clean bandage (dressing), if needed. Cream or ointment, if told by your health care provider. Clean gauze. How to care for your incision Cleaning the incision Ask your health care provider how to clean the incision. This may include: Wearing medical gloves. Using mild soap and water, or wound cleanser. Using a clean gauze to pat the incision dry after cleaning it. Dressing changes Wash your hands with soap and water for at least 20 seconds before and after you change the dressing. If soap and water are not available, use hand sanitizer. Do not use disinfectants or antiseptics, such as rubbing alcohol, to clean open wounds unless told by your health care provider. Change your dressing as told by your health care provider. Leave sutures, staples, skin glue, or adhesive strips in place. These skin closures may need to stay in place for 2 weeks or longer. If adhesive strip edges start to loosen and curl up, you may trim the loose edges. Do not remove adhesive strips completely unless your health care provider tells you to do that. Apply cream or ointment. Do this only as told by your health care provider. Cover the incision with a clean dressing. Ask your health care provider when you can start to leave the incision uncovered. Checking for infection Check your incision area every day for  signs of infection. Check for: More redness, swelling, or pain. More fluid or blood. New warmth, hardness, or a rash that develops along the incision. Pus or a bad smell.  Follow these instructions at home Medicines Take over-the-counter and prescription medicines only as told by your health care provider. If you were prescribed an antibiotic medicine, cream, or ointment, take or apply it as told by your health care provider. Do not stop using the antibiotic even if your condition improves. Eating and drinking Eat a diet that includes protein, vitamin A, vitamin C, and other nutrient-rich foods to help the wound heal. Foods rich in protein include meat, fish, eggs, dairy, beans, nuts, and protein supplement drinks. Foods rich in vitamin A include carrots and dark green, leafy vegetables. Foods rich in vitamin C include citrus fruits, tomatoes, broccoli, and peppers. Drink enough fluid to keep your urine pale yellow. General instructions  Do not take baths, swim, use a hot tub, or do anything that would put the incision underwater until your health care provider approves. Ask your health care provider if you may take showers. You may only be allowed to take sponge baths. Limit movement around your incision to promote healing. Avoid straining, lifting, or exercising for the first 2 weeks after your procedure, or for as long as told by your health care provider. Return to your normal activities as told by your health care provider. Ask your health care provider what activities are safe for you. Do not scratch, pick, or scrub the incision. Keep it covered as told by your health care provider.   Protect your incision from the sun when you are outside for the first 6 months, or for as long as told by your health care provider. Cover up the scar area or apply sunscreen that has an SPF of at least 30. Do not use any products that contain nicotine or tobacco, such as cigarettes, e-cigarettes, and  chewing tobacco. These can delay incision healing. If you need help quitting, ask your health care provider. Keep all follow-up visits. This is important. Contact a health care provider if: You have any of these signs of infection: More redness, swelling, or pain around your incision. More fluid or blood coming from your incision. New warmth or hardness around your incision. Pus or a bad smell coming from your incision. A rash that develops along the incision. You feel nauseous or you vomit. You are dizzy. Your sutures, staples, skin glue, or adhesive strips come undone. Your wound gets bigger. You have a fever. Get help right away if: Your incision bleeds through the dressing and the bleeding does not stop with gentle pressure. The edges of your incision open up and separate. These symptoms may represent a serious problem that is an emergency. Do not wait to see if the symptoms will go away. Get medical help right away. Call your local emergency services (911 in the [place] Do not drive yourself to the hospital. Summary Follow instructions from your health care provider about how to care for your incision. Wash your hands with soap and water for at least 20 seconds before and after you change the dressing. If soap and water are not available, use hand sanitizer. Check your incision area every day for signs of infection. Keep all follow-up visits. This is important. This information is not intended to replace advice given to you by your health care provider. Make sure you discuss any questions you have with your health care provider. Document Revised: 05/20/2020 Document Reviewed: 05/20/2020 Elsevier Patient Education  2023 Elsevier Inc.  

## 2021-12-10 NOTE — Addendum Note (Signed)
Addended by: Dema Severin on: 12/10/2021 12:11 PM   Modules accepted: Orders

## 2021-12-10 NOTE — Progress Notes (Signed)
    Procedures performed today:    Procedure:  Excision of 1.5 cm left buttock subcutaneous mass Risks, benefits, and alternatives explained and consent obtained. Time out conducted. Surface prepped with alcohol. 10cc lidocaine with epinephine infiltrated in a field block. Adequate anesthesia ensured. Area prepped and draped in a sterile fashion. Excision performed with: I made a linear incision with a #10 blade using sharp dissection I was able to remove a 1 cm to 1.5 cm subcutaneous mass en bloc, I dissected down to glistening subcutaneous tissue ensuring we removed the mass completely. I then closed the incision with 5 3-0 simple interrupted Ethilon sutures. Hemostasis achieved. Pt stable.  Independent interpretation of notes and tests performed by another provider:   None.  Brief History, Exam, Impression, and Recommendations:    Sebaceous cyst right buttock Ethan Bennett returns, he is a pleasant 61 year old male, we have been treating him for a draining mass right buttock since mid September. He has had a course of antibiotics, unfortunately he has continued to have drainage. Today we did a surgical exploration, I removed what appeared to be a large sebaceous cyst, and closed it primarily, we will do another course of doxycycline, tramadol for postop pain, return to see me in 10 days for suture movable.    ____________________________________________ Gwen Her. Dianah Field, M.D., ABFM., CAQSM., AME. Primary Care and Sports Medicine [facility]  Adjunct Professor of [place] [employer] [facility] of Medicine  Risk manager

## 2021-12-10 NOTE — Assessment & Plan Note (Signed)
Ethan Bennett returns, he is a pleasant 61 year old male, we have been treating him for a draining mass right buttock since mid September. He has had a course of antibiotics, unfortunately he has continued to have drainage. Today we did a surgical exploration, I removed what appeared to be a large sebaceous cyst, and closed it primarily, we will do another course of doxycycline, tramadol for postop pain, return to see me in 10 days for suture movable.

## 2021-12-19 ENCOUNTER — Encounter: Payer: Self-pay | Admitting: Sports Medicine

## 2021-12-19 ENCOUNTER — Ambulatory Visit: Payer: BC Managed Care – PPO | Admitting: Sports Medicine

## 2021-12-19 DIAGNOSIS — L723 Sebaceous cyst: Secondary | ICD-10-CM

## 2021-12-19 NOTE — Progress Notes (Signed)
    Procedures performed today:    None.  Independent interpretation of notes and tests performed by another provider:   None.  Brief History, Exam, Impression, and Recommendations:    Sebaceous cyst right buttock Ethan Bennett returns, he is a very pleasant 61 year old male, I had been treating him for a draining mass right buttock since September, he had a course of antibiotics but continued to have drainage. At the last visit approximately 10 days ago I did a surgical exploration and removed what appeared to be a large sebaceous cyst, I closed the incision primarily. He returns today, we removed the stitches and applied some Dermabond, incision is clean, dry, intact. Return to see me in 1 month as needed.    ____________________________________________ Gwen Her. Dianah Field, M.D., ABFM., CAQSM., AME. Primary Care and Sports Medicine [facility]  Adjunct Professor of [place] [employer] [facility] of Medicine  Risk manager

## 2021-12-19 NOTE — Assessment & Plan Note (Signed)
Finis returns, he is a very pleasant 61 year old male, I had been treating him for a draining mass right buttock since September, he had a course of antibiotics but continued to have drainage. At the last visit approximately 10 days ago I did a surgical exploration and removed what appeared to be a large sebaceous cyst, I closed the incision primarily. He returns today, we removed the stitches and applied some Dermabond, incision is clean, dry, intact. Return to see me in 1 month as needed.

## 2021-12-22 ENCOUNTER — Encounter: Payer: Self-pay | Admitting: Professional

## 2021-12-22 ENCOUNTER — Ambulatory Visit (INDEPENDENT_AMBULATORY_CARE_PROVIDER_SITE_OTHER): Payer: BC Managed Care – PPO | Admitting: Professional

## 2021-12-22 DIAGNOSIS — F330 Major depressive disorder, recurrent, mild: Secondary | ICD-10-CM | POA: Insufficient documentation

## 2021-12-22 DIAGNOSIS — F4322 Adjustment disorder with anxiety: Secondary | ICD-10-CM | POA: Diagnosis not present

## 2021-12-22 NOTE — Progress Notes (Signed)
[place] Counselor/Therapist Progress Note  Patient ID: Ethan Bennett, MRN: [id number],    Date: 12/22/2021  Time Spent: 34 minutes 8-834am   Treatment Type: Individual Therapy  Risk Assessment: Danger to Self:  No Self-injurious Behavior: No Danger to Others: No  Subjective: This session was held via video teletherapy The patient consented to video teletherapy and was located at his home during this session. He is aware it is the responsibility of the patient to secure confidentiality on his end of the session. The provider was in a private home office for the duration of this session.   The patient arrived on time for his webex session.   Issues addressed: 1-professional -very busy for the next two week with several new contracts 2-mood a-denies feeling depressed b-the further removed from attempts to engage with family the better it gets -not being on the family chat works well for him -educated on Ambiguous Grief and provided him with book title   -Ambiguous Loss by Zara Council 2-personal a-has started exercising b-planning to get involved in activities -music lessons c-considering moving to a new place -unsure this moment is the time given that 61 y/o MIL is still living in [place] -also waiting to see where the children land d-stays busy and doesn't think about his brother that lives in neighborhood e-holidays -daughter Corky Sox going to meet partner's parents -married son/DIL are going to his in-laws -only going to be he, his wife, and youngest son 3-future visits -pt admits he his feeling much better -Clinician discussed monitoring visits to stay ahead of any potential MH issues   -pt agreeable and thinks it would be helpful   -meet in two months with understanding that he can call sooner as needed.  Treatment Plan Problems Addressed  Grief / Loss Unresolved, Unipolar Depression Goals 1. Alleviate depressive symptoms and return to previous  level of effective functioning. Objective Increasingly verbalize hopeful and positive statements regarding self, others, and the future. Target Date: 2022-12-08 Frequency: Biweekly  Progress: 0 Modality: individual  Related Interventions Teach the client more about depression and how to recognize and accept some sadness as a normal variation in feeling. Objective Learn and implement relapse prevention skills. Target Date: 2022-12-08 Frequency: Biweekly  Progress: 0 Modality: individual  Related Interventions Discuss with the client the distinction between a lapse and relapse, associating a lapse with a rather common, temporary setback that may involve, for example, re-experiencing a depressive thought and/or urge to withdraw or avoid (perhaps as related to some loss or conflict) and a relapse as a sustained return to a pattern of depressive thinking and feeling usually accompanied by interpersonal withdrawal and/or avoidance. Build the client's relapse prevention skills by helping him/her identify early warning signs of relapse and rehearsing the use of skills learned during therapy to manage them. Objective Identify important people in life, past and present, and describe the quality, good and poor, of those relationships. Target Date: 2022-12-08 Frequency: Biweekly  Progress: 0 Modality: individual  Related Interventions Conduct Interpersonal Therapy (see Interpersonal Psychotherapy of Depression by Mirian Capuchin al.), beginning with the assessment of the client's "interpersonal inventory" of important past and present relationships; develop a case formulation linking depression to grief, interpersonal role disputes, role transitions, and/or interpersonal deficits). 2. Appropriately grieve the loss in order to normalize mood and to return to previously adaptive level of functioning. 3. Begin a healthy grieving process around the loss. Objective Identify how the use of substances has aided the  avoidance of feelings  associated with the loss. Target Date: 2022-12-08 Frequency: Biweekly  Progress: 0 Modality: individual  Related Interventions Assess the role that substance abuse has played as an escape for the client from the pain or guilt of loss. Objective Read books on the topic of grief to better understand the loss experience and to increase a sense of hope. Target Date: 2022-12-08 Frequency: Biweekly  Progress: 0 Modality: individual  Related Interventions Ask the client to read books on grief and loss (e.g., Getting to the Other Side of Grief: Overcoming the Loss of a Spouse by Zonnebelt-Smeenge and Dora Sims; Good Grief by Nelle Don; When Bad Things Happen to Good People by Karie Georges; How Can It Be All Right When Everything Is All Wrong? by [facility]); process the content. Objective Identify how avoiding dealing with loss has negatively impacted life. Target Date: 2022-12-08 Frequency: Biweekly  Progress: 0 Modality: individual  Related Interventions Ask the client to list ways that avoidance of grieving has negatively impacted his/her life. Objective Reengage in activities with family, friends, coworkers, and others. Target Date: 2022-12-08 Frequency: Biweekly  Progress: 0 Modality: individual  Related Interventions Assist the client in recommitting and reengaging in the primary social positive roles in which he/she has functioned prior to the loss. Promote behavioral activation by assisting the client in listing activities which he/she previously enjoyed but has not engaged in since experiencing the loss and then encourage reengagement in these activities (or assign "Identify and Schedule Pleasant Activities" in the Adult Psychotherapy Homework Planner by Bryn Gulling). 4. Develop an awareness of how the avoidance of grieving has affected life and begin the healing process. 5. Develop healthy interpersonal relationships that lead to the alleviation and help prevent the relapse of  depression. 6. Recognize, accept, and cope with feelings of depression. 7. Resolve the loss, reengaging in old relationships and initiating new contacts with others.  Diagnosis:Major depressive disorder, recurrent episode, mild ([place])  Adjustment disorder with anxiety  Plan:  -meet again on Tuesday, February 17, 2022 at 8am.

## 2021-12-28 ENCOUNTER — Other Ambulatory Visit: Payer: Self-pay | Admitting: Family Medicine

## 2021-12-28 DIAGNOSIS — I10 Essential (primary) hypertension: Secondary | ICD-10-CM

## 2021-12-29 NOTE — Telephone Encounter (Signed)
Please advise pt that a 90 day supply of his bp medication has been sent to his pharmacy. He will need to schedule a f/u appointment for future refills. Thanks.

## 2021-12-31 ENCOUNTER — Telehealth: Payer: Self-pay | Admitting: Family Medicine

## 2021-12-31 DIAGNOSIS — K420 Umbilical hernia with obstruction, without gangrene: Secondary | ICD-10-CM | POA: Diagnosis not present

## 2021-12-31 NOTE — Telephone Encounter (Signed)
Pt called in to schedule a follow up,then inquired about switching providers. He stated the reason being that he prefers a male provider.

## 2021-12-31 NOTE — Telephone Encounter (Signed)
Totally fine with me

## 2021-12-31 NOTE — Telephone Encounter (Signed)
Lvm for patient to let them know that Rx was filled and he needs to call back and schedule a appointment for further refills. -tvt

## 2022-01-01 NOTE — Telephone Encounter (Signed)
Soooo, Dr. Zigmund Orie?

## 2022-01-01 NOTE — Telephone Encounter (Signed)
Called pt in regards to switching providers. I left voicemail about your response and asked if he would like me to reach out to Dr.Matthews.

## 2022-01-12 ENCOUNTER — Ambulatory Visit: Admit: 2022-01-12 | Discharge status: Absent without leave | Payer: Self-pay

## 2022-01-12 ENCOUNTER — Ambulatory Visit
Admission: EM | Admit: 2022-01-12 | Discharge: 2022-01-12 | Disposition: A | Discharge status: Home / Self Care | Payer: BC Managed Care – PPO | Attending: Family Medicine | Admitting: Family Medicine

## 2022-01-12 DIAGNOSIS — J0100 Acute maxillary sinusitis, unspecified: Secondary | ICD-10-CM

## 2022-01-12 DIAGNOSIS — J309 Allergic rhinitis, unspecified: Secondary | ICD-10-CM

## 2022-01-12 DIAGNOSIS — R059 Cough, unspecified: Secondary | ICD-10-CM | POA: Diagnosis not present

## 2022-01-12 MED ORDER — FEXOFENADINE HCL 180 MG PO TABS: 180.0000 mg | ORAL_TABLET | Freq: Every day | ORAL | 0 refills | Status: DC

## 2022-01-12 MED ORDER — BENZONATATE 200 MG PO CAPS: 200.0000 mg | ORAL_CAPSULE | Freq: Three times a day (TID) | ORAL | 0 refills | Status: AC | PRN

## 2022-01-12 MED ORDER — DOXYCYCLINE HYCLATE 100 MG PO CAPS: 100.0000 mg | ORAL_CAPSULE | Freq: Two times a day (BID) | ORAL | 0 refills | Status: AC

## 2022-01-12 MED ORDER — PREDNISONE 20 MG PO TABS: ORAL_TABLET | ORAL | 0 refills | Status: DC

## 2022-01-12 MED ORDER — PROMETHAZINE-DM 6.25-15 MG/5ML PO SYRP: 5.0000 mL | ORAL_SOLUTION | Freq: Two times a day (BID) | ORAL | 0 refills | Status: DC | PRN

## 2022-01-12 NOTE — ED Triage Notes (Signed)
Pt c/o cough and congestion x 9 days. Wife recently sick as well, dx with URI. Being tx by Dr Eppie Gibson with zpak. Treating himself with mucinex and OTC cough medicine prn.

## 2022-01-12 NOTE — Discharge Instructions (Addendum)
Directed patient to take medication as directed with food to completion.  Advised patient to take prednisone and Allegra with first dose of Doxycycline for the next 5 of 7 days.  Advised may use Allegra as needed afterwards for concurrent postnasal drainage/drip.  Advised may use Tessalon daily or as needed for cough.  Advised may use Promethazine DM at night for cough due to sedate of effects.  Encouraged patient increase daily water intake to 64 ounces per day while taking these medications.  Advised if symptoms worsen and/or unresolved please follow-up with PCP or here for further evaluation.

## 2022-01-12 NOTE — ED Provider Notes (Signed)
[facility]    CSN: [id number] Arrival date & time: 01/12/22  1110      History   Chief Complaint Chief Complaint  Patient presents with   Cough   Nasal Congestion    HPI Ethan Bennett is a 61 y.o. male.   HPI 61 year old male presents with cough and congestion for 9 days.  Reports his wife has been sick as well diagnosed with URI and being treated by Linford Arnold, MD with Zithromax per patient.  PMH significant for HLD, HTN, and adjustment disorder with anxiety.  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   Adjustment disorder with anxiety 12/22/2021   Sebaceous cyst right buttock 11/21/2021   Umbilical pain 06/24/2021   Umbilical hernia without obstruction and without gangrene 06/24/2021   Family history of aortic aneurysm 06/24/2021   Adjustment disorder with mixed anxiety and depressed mood 03/11/2021   Chronic mastoiditis of left side 02/06/2021   History of ear surgery 02/06/2021   Hyperlipidemia 07/25/2020   Hearing loss 08/09/2018   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 Authorizing Provider  benzonatate (TESSALON) 200 MG capsule Take 1 capsule (200 mg total) by mouth 3 (three) times daily as needed for up to 7 days. 01/12/22 01/19/22 Yes Trevor Iha, FNP  doxycycline (VIBRAMYCIN) 100 MG capsule Take 1 capsule (100 mg total) by mouth 2 (two) times daily for 7 days.  01/12/22 01/19/22 Yes Trevor Iha, FNP  fexofenadine [facility] ALLERGY) 180 MG tablet Take 1 tablet (180 mg total) by mouth daily for 15 days. 01/12/22 01/27/22 Yes Trevor Iha, FNP  predniSONE (DELTASONE) 20 MG tablet Take 3 tabs PO daily x 5 days. 01/12/22  Yes Trevor Iha, FNP  promethazine-dextromethorphan (PROMETHAZINE-DM) 6.25-15 MG/5ML syrup Take 5 mLs by mouth 2 (two) times daily as needed for cough. 01/12/22  Yes Trevor Iha, FNP  atorvastatin (LIPITOR) 40 MG tablet Take 1 tablet (40 mg total) by mouth at bedtime. 01/21/21   Agapito Games, MD  azelastine (ASTELIN) 0.1 % nasal spray USE 2 SPRAYS IN EACH NOSTRIL TWICE DAILY AS DIRECTED 10/09/20   Agapito Games, MD  betamethasone, augmented, (DIPROLENE) 0.05 % lotion 1 application a thin film to affected area once a day as needed during winter externally 14 days 05/30/21   Agapito Games, MD  finasteride (PROSCAR) 5 MG tablet TAKE 1 TABLET(5 MG) BY MOUTH DAILY 06/26/20   Agapito Games, MD  lisinopril (ZESTRIL) 40 MG tablet TAKE 1 TABLET BY MOUTH EVERY DAY 12/29/21   Agapito Games, MD  RESTASIS 0.05 % ophthalmic emulsion  11/12/19   [provider]  traMADol (ULTRAM) 50 MG tablet Take 1 tablet (50 mg total) by mouth every 8 (eight) hours as needed for moderate pain. 12/10/21   Monica Becton, MD  Family History Family History  Problem Relation Age of Onset   Hypertension Mother    Cancer - Other Father        biliary   Hypertension Sister    Hypertension Brother    Hypertension Brother    Melanoma Brother     Social History Social History   Tobacco Use   Smoking status: Former    Types: Cigarettes    Quit date: 01/07/1981    Years since quitting: 41.0   Smokeless tobacco: Former  Substance Use Topics   Alcohol use: Yes    Alcohol/week: 6.0 standard drinks of alcohol    Types: 2 Glasses of wine, 2 Cans of beer, 2 Shots of liquor per week    Comment: 7-8 drinks a  week of beer/wine or liquor   Drug use: No     Allergies   Patient has no known allergies.   Review of Systems Review of Systems  HENT:  Positive for congestion.   Respiratory:  Positive for cough.   All other systems reviewed and are negative.    Physical Exam Triage Vital Signs ED Triage Vitals  Enc Vitals Group     BP      Pulse      Resp      Temp      Temp src      SpO2      Weight      Height      Head Circumference      Peak Flow      Pain Score      Pain Loc      Pain Edu?      Excl. in GC?    No data found.  Updated Vital Signs BP (!) 153/97 (BP Location: Right Arm)   Pulse 75   Temp 98.4 F (36.9 C) (Oral)   Resp 17   SpO2 99%      Physical Exam Vitals and nursing note reviewed.  Constitutional:      Appearance: Normal appearance. He is normal weight. He is ill-appearing.  HENT:     Head: Normocephalic and atraumatic.     Right Ear: Tympanic membrane and external ear normal.     Left Ear: Tympanic membrane and external ear normal.     Ears:     Comments: Significant eustachian tube dysfunction noted bilaterally    Nose: Nose normal.     Mouth/Throat:     Mouth: Mucous membranes are moist.     Pharynx: Oropharynx is clear.     Comments: Significant amount of clear drainage of posterior oropharynx noted Eyes:     Extraocular Movements: Extraocular movements intact.     Conjunctiva/sclera: Conjunctivae normal.     Pupils: Pupils are equal, round, and reactive to light.  Cardiovascular:     Rate and Rhythm: Normal rate and regular rhythm.     Pulses: Normal pulses.     Heart sounds: Normal heart sounds.  Pulmonary:     Effort: Pulmonary effort is normal.     Breath sounds: Normal breath sounds. No wheezing, rhonchi or rales.     Comments: Infrequent nonproductive cough on exam, no adventitious breath sounds noted Musculoskeletal:        General: Normal range of motion.     Cervical back: Normal range of motion and neck supple. No  tenderness.  Lymphadenopathy:     Cervical: No cervical adenopathy.  Skin:    General: Skin is warm and dry.  Neurological:  General: No focal deficit present.     Mental Status: He is alert and oriented to person, place, and time.      UC Treatments / Results  Labs (all labs ordered are listed, but only abnormal results are displayed) Labs Reviewed - No data to display  EKG   Radiology No results found.  Procedures Procedures (including critical care time)  Medications Ordered in UC Medications - No data to display  Initial Impression / Assessment and Plan / UC Course  I have reviewed the triage vital signs and the nursing notes.  Pertinent labs & imaging results that were available during my care of the patient were reviewed by me and considered in my medical decision making (see chart for details).     MDM: 1.  Acute maxillary sinusitis, recurrence not specified-doxycycline; 2.  Cough-Rx'd prednisone, Tessalon, Promethazine DM; 3.  Allergic rhinitis-Rx'd Allegra. Directed patient to take medication as directed with food to completion.  Advised patient to take prednisone and Allegra with first dose of Doxycycline for the next 5 of 7 days.  Advised may use Allegra as needed afterwards for concurrent postnasal drainage/drip.  Advised may use Tessalon daily or as needed for cough.  Advised may use Promethazine DM at night for cough due to sedate of effects.  Encouraged patient increase daily water intake to 64 ounces per day while taking these medications.  Advised if symptoms worsen and/or unresolved please follow-up with PCP or here for further evaluation. Final Clinical Impressions(s) / UC Diagnoses   Final diagnoses:  Cough, unspecified type  Allergic rhinitis, unspecified seasonality, unspecified trigger  Acute maxillary sinusitis, recurrence not specified     Discharge Instructions      Directed patient to take medication as directed with food to completion.   Advised patient to take prednisone and Allegra with first dose of Doxycycline for the next 5 of 7 days.  Advised may use Allegra as needed afterwards for concurrent postnasal drainage/drip.  Advised may use Tessalon daily or as needed for cough.  Advised may use Promethazine DM at night for cough due to sedate of effects.  Encouraged patient increase daily water intake to 64 ounces per day while taking these medications.  Advised if symptoms worsen and/or unresolved please follow-up with PCP or here for further evaluation.     ED Prescriptions     Medication Sig Dispense Auth. Provider   doxycycline (VIBRAMYCIN) 100 MG capsule Take 1 capsule (100 mg total) by mouth 2 (two) times daily for 7 days. 14 capsule Trevor Iha, FNP   predniSONE (DELTASONE) 20 MG tablet Take 3 tabs PO daily x 5 days. 15 tablet Trevor Iha, FNP   fexofenadine [facility] ALLERGY) 180 MG tablet Take 1 tablet (180 mg total) by mouth daily for 15 days. 15 tablet Trevor Iha, FNP   benzonatate (TESSALON) 200 MG capsule Take 1 capsule (200 mg total) by mouth 3 (three) times daily as needed for up to 7 days. 40 capsule Trevor Iha, FNP   promethazine-dextromethorphan (PROMETHAZINE-DM) 6.25-15 MG/5ML syrup Take 5 mLs by mouth 2 (two) times daily as needed for cough. 118 mL Trevor Iha, FNP      PDMP not reviewed this encounter.   Trevor Iha, FNP 01/12/22 1203

## 2022-02-02 ENCOUNTER — Ambulatory Visit: Payer: BC Managed Care – PPO | Admitting: Family Medicine

## 2022-02-02 ENCOUNTER — Encounter: Payer: Self-pay | Admitting: Family Medicine

## 2022-02-02 VITALS — BP 124/48 | HR 82 | Ht 71.0 in | Wt 206.0 lb

## 2022-02-02 DIAGNOSIS — Z1211 Encounter for screening for malignant neoplasm of colon: Secondary | ICD-10-CM

## 2022-02-02 DIAGNOSIS — I10 Essential (primary) hypertension: Secondary | ICD-10-CM | POA: Diagnosis not present

## 2022-02-02 NOTE — Progress Notes (Signed)
   Established Patient Office Visit  Subjective   Patient ID: Ethan Bennett, male    DOB: 1960/12/14  Age: 61 y.o. MRN: [id number]  Chief Complaint  Patient presents with   Hypertension    HPI  Hypertension- Pt denies chest pain, SOB, dizziness, or heart palpitations.  Taking meds as directed w/o problems.  Denies medication side effects.  No swelling.  Underwent hernia repair last month.  Also was seen in urgent care about 2 weeks ago for upper respiratory symptoms and treated with antibiotics and prednisone.  He said he never ended up taking the prednisone to the antibiotic.  He is feeling some better does not completely he still does have some postnasal drainage and drip and slight cough because of the drainage but he feels like he is getting better.  He also had recent labs including testosterone levels.  Testosterone level looks great.  No sign of diabetes or prediabetes.  DL went under 967.    ROS    Objective:     BP (!) 124/48   Pulse 82   Ht 5\' 11"  (1.803 m)   Wt 206 lb (93.4 kg)   SpO2 98%   BMI 28.73 kg/m    Physical Exam Constitutional:      Appearance: He is well-developed.  HENT:     Head: Normocephalic and atraumatic.  Cardiovascular:     Rate and Rhythm: Normal rate and regular rhythm.     Heart sounds: Normal heart sounds.  Pulmonary:     Effort: Pulmonary effort is normal.     Breath sounds: Normal breath sounds.  Skin:    General: Skin is warm and dry.  Neurological:     Mental Status: He is alert and oriented to person, place, and time.  Psychiatric:        Behavior: Behavior normal.      No results found for any visits on 02/02/22.    The 10-year ASCVD risk score (Arnett DK, et al., 2019) is: 7%    Assessment & Plan:   Problem List Items Addressed This Visit       Cardiovascular and Mediastinum   Essential hypertension - Primary    Well controlled. Continue current regimen. Follow up in  6 mo       Other Visit  Diagnoses     Screening for malignant neoplasm of colon           Based on records he evidently had a colonoscopy in 2015 but we do not have an actual record of the scope.  He says it was performed Dr. 2016.  So we will fax [facility] and see if they can send Matthias Hughs a copy of the report.  Reviewed recent labs.  Does not need any additional lab work today.  Return in about 27 weeks (around 08/10/2022) for Hypertension.    10/10/2022, MD

## 2022-02-02 NOTE — Assessment & Plan Note (Signed)
Well controlled. Continue current regimen. Follow up in  6 mo  

## 2022-02-04 ENCOUNTER — Encounter: Payer: Self-pay | Admitting: Family Medicine

## 2022-02-04 DIAGNOSIS — H43821 Vitreomacular adhesion, right eye: Secondary | ICD-10-CM | POA: Diagnosis not present

## 2022-02-04 DIAGNOSIS — D3131 Benign neoplasm of right choroid: Secondary | ICD-10-CM | POA: Diagnosis not present

## 2022-02-04 DIAGNOSIS — H43812 Vitreous degeneration, left eye: Secondary | ICD-10-CM | POA: Diagnosis not present

## 2022-02-17 ENCOUNTER — Encounter: Payer: Self-pay | Admitting: Professional

## 2022-02-17 ENCOUNTER — Ambulatory Visit (INDEPENDENT_AMBULATORY_CARE_PROVIDER_SITE_OTHER): Payer: BC Managed Care – PPO | Admitting: Professional

## 2022-02-17 ENCOUNTER — Other Ambulatory Visit: Payer: Self-pay | Admitting: Family Medicine

## 2022-02-17 DIAGNOSIS — F330 Major depressive disorder, recurrent, mild: Secondary | ICD-10-CM

## 2022-02-17 DIAGNOSIS — F4322 Adjustment disorder with anxiety: Secondary | ICD-10-CM

## 2022-02-17 DIAGNOSIS — J309 Allergic rhinitis, unspecified: Secondary | ICD-10-CM

## 2022-02-17 NOTE — Progress Notes (Signed)
[facility] Counselor/Therapist Progress Note  Patient ID: Ethan Bennett, MRN: [id number],    Date: 02/17/2022  Time Spent: 29 minutes 8-829am   Treatment Type: Individual Therapy  Risk Assessment: Danger to Self:  No Self-injurious Behavior: No Danger to Others: No  Subjective: This session was held via video teletherapy The patient consented to video teletherapy and was located at his home during this session. He is aware it is the responsibility of the patient to secure confidentiality on his end of the session. The provider was in a private home office for the duration of this session.   The patient arrived on time for his webex session.   Issues addressed: 1-personal a-reviewed some materials from the past b-"the story we tell ourselves" c-he has been walking but messed up his knee and got off track d-started taking guitar lessons -that has been good e-"I started telling myself different things" f-he has not kept issues with his family on the top of his mind -admits that it is not good to share the message with his children g-he does wonder if SAD is an issue for him -he got a go-lite and uses daily and that appears to help -he recalls a Diplomatic Services operational officer telling a Clinical biochemist that he was really quiet when the weather is crappy h-he admits that he is doing a poor job at moderating intake when he has a drink -doesn't want to model that for his children -will feel the need to have 2-3 more after returning from dinner with friends where he consumed 2 drinks -plans to work on after getting through the holidays 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 Alleviate depressive symptoms and return to previous level of effective functioning. Objective Increasingly verbalize hopeful and positive statements regarding self, others, and the  future. Target Date: 2022-12-08 Frequency: Biweekly  Progress: 0 Modality: individual  Related Interventions Teach the client more about depression and how to recognize and accept some sadness as a normal variation in feeling. Objective Learn and implement relapse prevention skills. Target Date: 2022-12-08 Frequency: Biweekly  Progress: 0 Modality: individual  Related Interventions Discuss with the client the distinction between a lapse and relapse, associating a lapse with a rather common, temporary setback that may involve, for example, re-experiencing a depressive thought and/or urge to withdraw or avoid (perhaps as related to some loss or conflict) and a relapse as a sustained return to a pattern of depressive thinking and feeling usually accompanied by interpersonal withdrawal and/or avoidance. Build the client's relapse prevention skills by helping him/her identify early warning signs of relapse and rehearsing the use of skills learned during therapy to manage them. Objective Identify important people in life, past and present, and describe the quality, good and poor, of those relationships. Target Date: 2022-12-08 Frequency: Biweekly  Progress: 0 Modality: individual  Related Interventions Conduct Interpersonal Therapy (see Interpersonal Psychotherapy of Depression by Casimer Lanius al.), beginning with the assessment of the client's "interpersonal inventory" of important past and present relationships; develop a case formulation linking depression to grief, interpersonal role disputes, role transitions, and/or interpersonal deficits). 2. Appropriately grieve the loss in order to normalize mood and to return to previously adaptive level of functioning. 3. Begin a healthy grieving process around the loss. Objective Identify how the use of substances has aided the avoidance of feelings associated with the loss. Target Date: 2022-12-08 Frequency: Biweekly  Progress: 0  Modality: individual   Related Interventions Assess the role that substance abuse has played as an escape for the client from the pain or guilt of loss. Objective Read books on the topic of grief to better understand the loss experience and to increase a sense of hope. Target Date: 2022-12-08 Frequency: Biweekly  Progress: 0 Modality: individual  Related Interventions Ask the client to read books on grief and loss (e.g., Getting to the Other Side of Grief: Overcoming the Loss of a Spouse by Zonnebelt-Smeenge and Joana Reamer; Good Grief by Alphia Kava; When Bad Things Happen to Good People by Pryor Montes; How Can It Be All Right When Everything Is All Wrong? by [facility]); process the content. Objective Identify how avoiding dealing with loss has negatively impacted life. Target Date: 2022-12-08 Frequency: Biweekly  Progress: 0 Modality: individual  Related Interventions Ask the client to list ways that avoidance of grieving has negatively impacted his/her life. Objective Reengage in activities with family, friends, coworkers, and others. Target Date: 2022-12-08 Frequency: Biweekly  Progress: 0 Modality: individual  Related Interventions Assist the client in recommitting and reengaging in the primary social positive roles in which he/she has functioned prior to the loss. Promote behavioral activation by assisting the client in listing activities which he/she previously enjoyed but has not engaged in since experiencing the loss and then encourage reengagement in these activities (or assign "Identify and Schedule Pleasant Activities" in the Adult Psychotherapy Homework Planner by Stephannie Li). 4. Develop an awareness of how the avoidance of grieving has affected life and begin the healing process. 5. Develop healthy interpersonal relationships that lead to the alleviation and help prevent the relapse of depression. 6. Recognize, accept, and cope with feelings of depression. 7. Resolve the loss, reengaging in old relationships and  initiating new contacts with others.  Diagnosis:Major depressive disorder, recurrent episode, mild (HCC)  Adjustment disorder with anxiety  Plan:  -patient will call as needed for sessions

## 2022-02-18 ENCOUNTER — Other Ambulatory Visit: Payer: Self-pay | Admitting: Family Medicine

## 2022-02-18 ENCOUNTER — Encounter: Payer: Self-pay | Admitting: Family Medicine

## 2022-02-18 DIAGNOSIS — J309 Allergic rhinitis, unspecified: Secondary | ICD-10-CM

## 2022-02-19 ENCOUNTER — Encounter: Payer: Self-pay | Admitting: Family Medicine

## 2022-02-19 ENCOUNTER — Other Ambulatory Visit: Payer: Self-pay | Admitting: Family Medicine

## 2022-02-19 DIAGNOSIS — J309 Allergic rhinitis, unspecified: Secondary | ICD-10-CM

## 2022-02-19 MED ORDER — VALACYCLOVIR HCL 1 G PO TABS: 1000.0000 mg | ORAL_TABLET | Freq: Two times a day (BID) | ORAL | 1 refills | Status: DC | PRN

## 2022-02-19 MED ORDER — AZELASTINE HCL 137 MCG/SPRAY NA SOLN: 2.0000 | Freq: Two times a day (BID) | NASAL | 0 refills | Status: DC

## 2022-03-08 ENCOUNTER — Encounter: Payer: Self-pay | Admitting: Family Medicine

## 2022-03-09 MED ORDER — BETAMETHASONE DIPROPIONATE AUG 0.05 % EX LOTN: TOPICAL_LOTION | Freq: Two times a day (BID) | CUTANEOUS | 1 refills | Status: DC | PRN

## 2022-03-18 ENCOUNTER — Other Ambulatory Visit: Payer: Self-pay | Admitting: Family Medicine

## 2022-03-18 DIAGNOSIS — J309 Allergic rhinitis, unspecified: Secondary | ICD-10-CM

## 2022-03-19 DIAGNOSIS — H9012 Conductive hearing loss, unilateral, left ear, with unrestricted hearing on the contralateral side: Secondary | ICD-10-CM | POA: Diagnosis not present

## 2022-04-02 ENCOUNTER — Other Ambulatory Visit: Payer: Self-pay | Admitting: Family Medicine

## 2022-04-02 DIAGNOSIS — E785 Hyperlipidemia, unspecified: Secondary | ICD-10-CM

## 2022-04-03 DIAGNOSIS — H9012 Conductive hearing loss, unilateral, left ear, with unrestricted hearing on the contralateral side: Secondary | ICD-10-CM | POA: Diagnosis not present

## 2022-04-06 DIAGNOSIS — Z9889 Other specified postprocedural states: Secondary | ICD-10-CM | POA: Diagnosis not present

## 2022-04-06 DIAGNOSIS — H7012 Chronic mastoiditis, left ear: Secondary | ICD-10-CM | POA: Diagnosis not present

## 2022-04-21 ENCOUNTER — Other Ambulatory Visit: Payer: Self-pay

## 2022-04-21 ENCOUNTER — Other Ambulatory Visit: Payer: Self-pay | Admitting: Family Medicine

## 2022-04-21 ENCOUNTER — Encounter: Payer: Self-pay | Admitting: Family Medicine

## 2022-04-21 MED ORDER — FINASTERIDE 5 MG PO TABS: 5.0000 mg | ORAL_TABLET | Freq: Every day | ORAL | 0 refills | Status: DC

## 2022-04-22 DIAGNOSIS — H04123 Dry eye syndrome of bilateral lacrimal glands: Secondary | ICD-10-CM | POA: Diagnosis not present

## 2022-05-03 IMAGING — CT CT HEAD W/O CM
4 series · 17 of 47 positions shown, 19 images · non-contrast
Comparison: None.

CLINICAL DATA: Dizziness and headaches with right eye pain, initial
encounter

EXAM:
CT HEAD WITHOUT CONTRAST
TECHNIQUE: Contiguous axial images were obtained from the base of the skull
through the vertex without intravenous contrast.

[Series 2: head wo · axial · 0.42mm/px · z∈[-145,-25]mm · 7 of 32 slices shown, 9 images]
[im 4/32  brain]
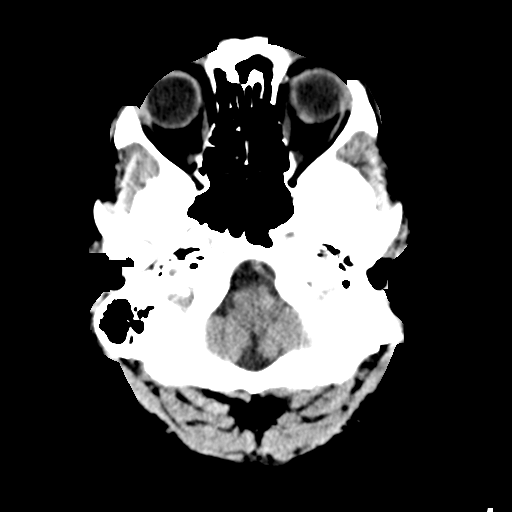
[im 4/32  bone]
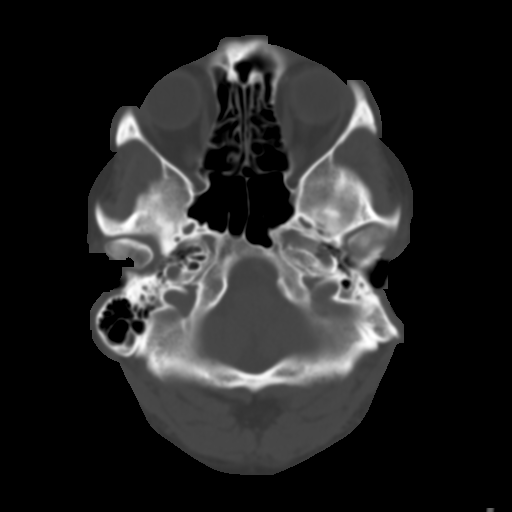
[im 8/32  brain]
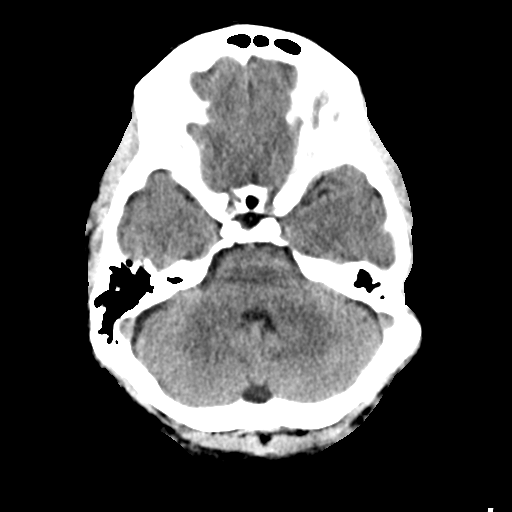
[im 12/32  brain]
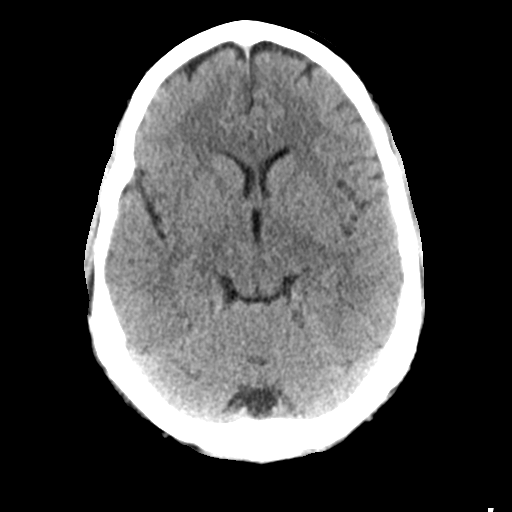
[im 16/32  brain]
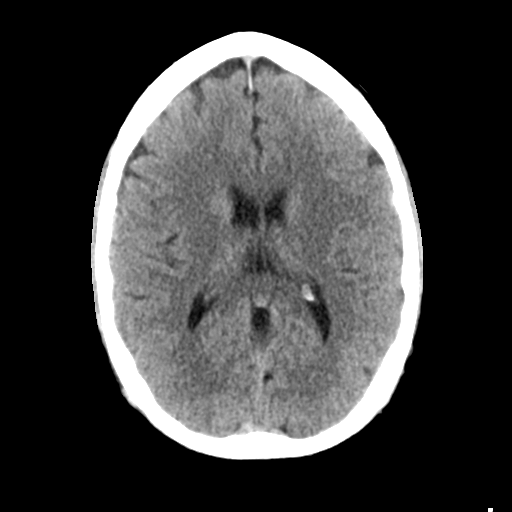
[im 20/32  brain]
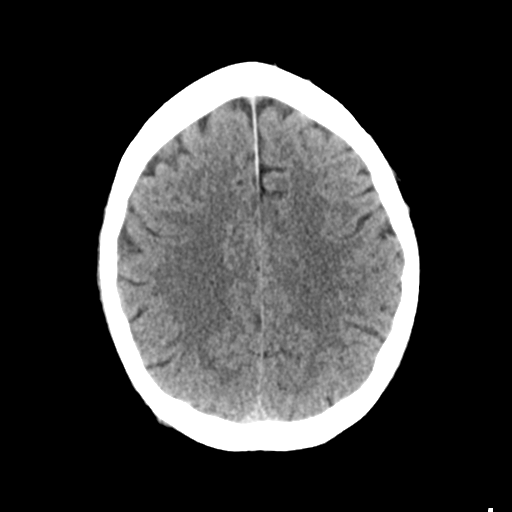
[im 20/32  bone]
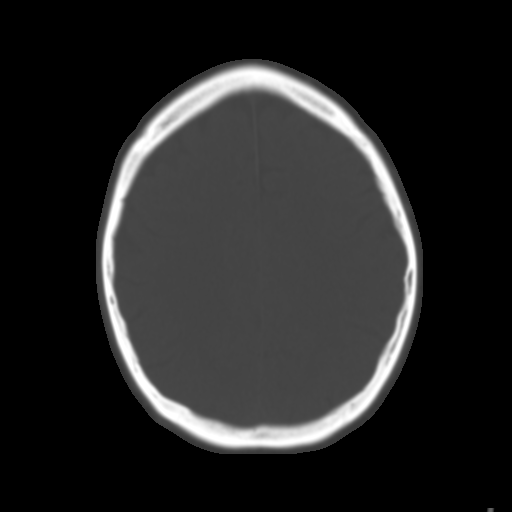
[im 24/32  brain]
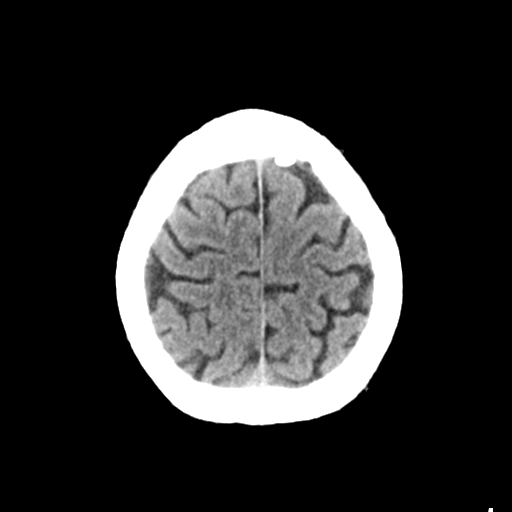
[im 28/32  brain]
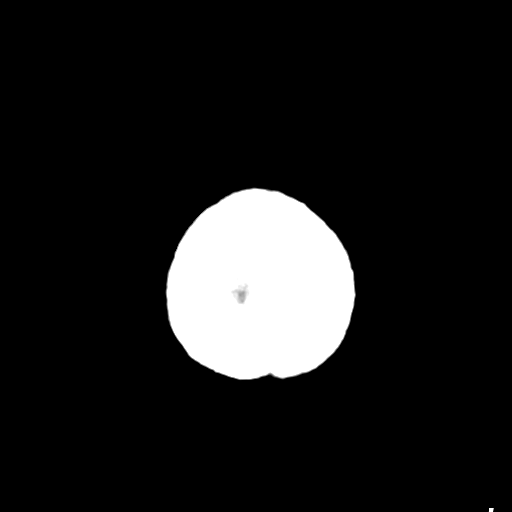

[Series 3: head bone 2x2 · axial · 0.42mm/px · z∈[-146,-90]mm · 4 of 79 slices shown]
[im 8/79  bone]
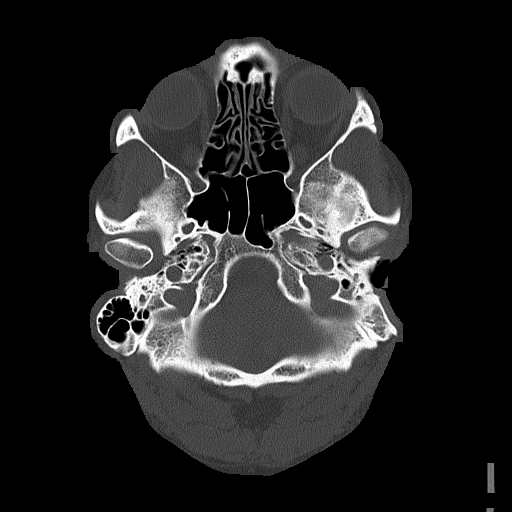
[im 16/79  bone]
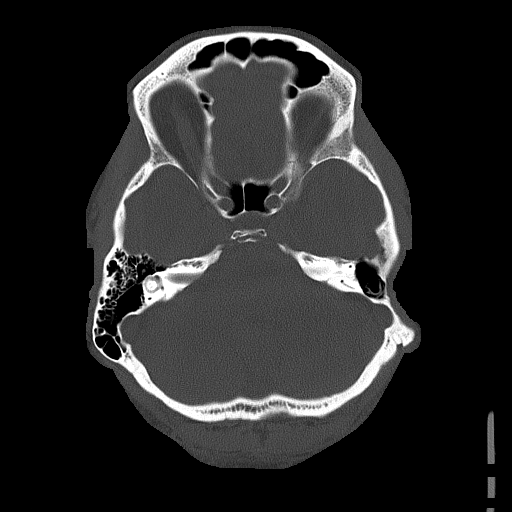
[im 24/79  bone]
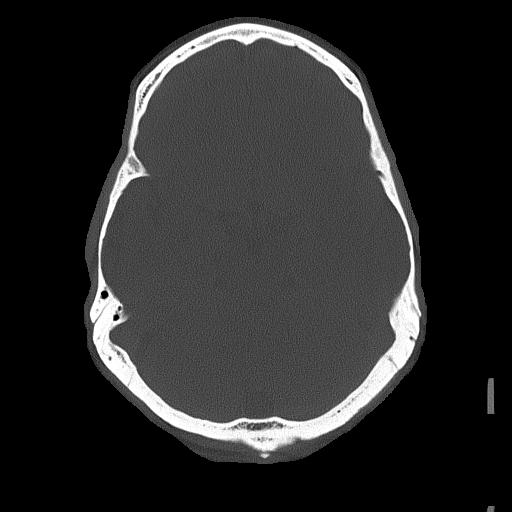
[im 36/79  bone]
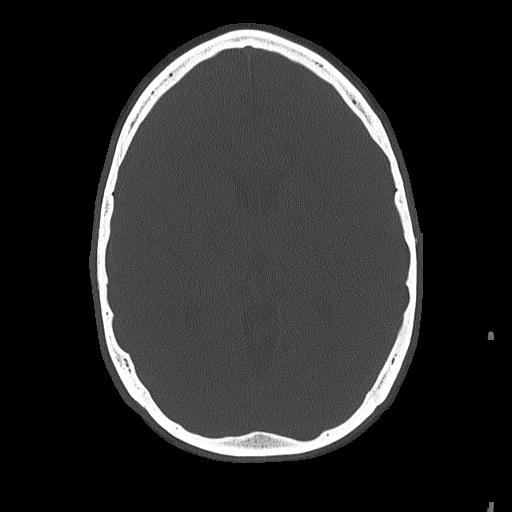

[Series 4: head wo coronal · coronal · 0.33mm/px · 3 of 66 slices shown]
[im 22/66  brain]
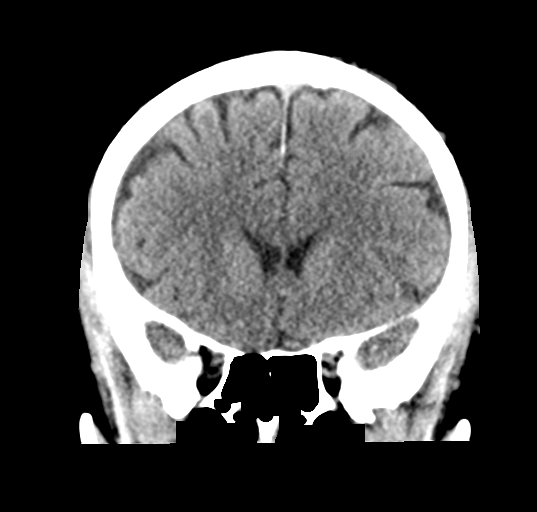
[im 29/66  brain]
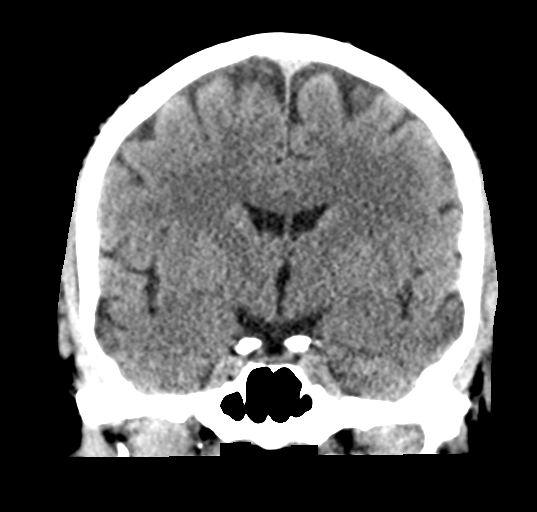
[im 37/66  brain]
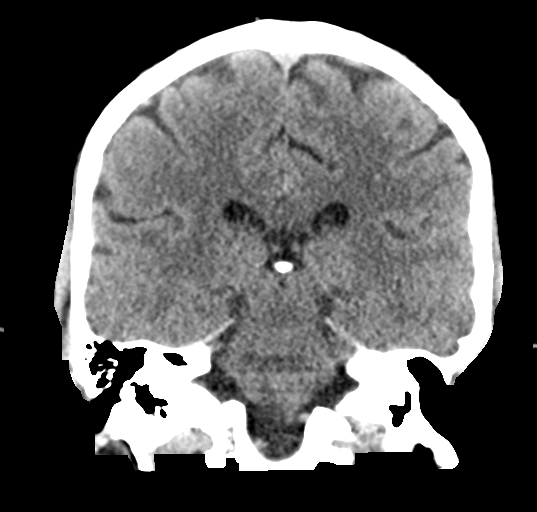

[Series 5: head wo sagittal · sagittal · 0.33mm/px · 3 of 59 slices shown]
[im 20/59  brain]
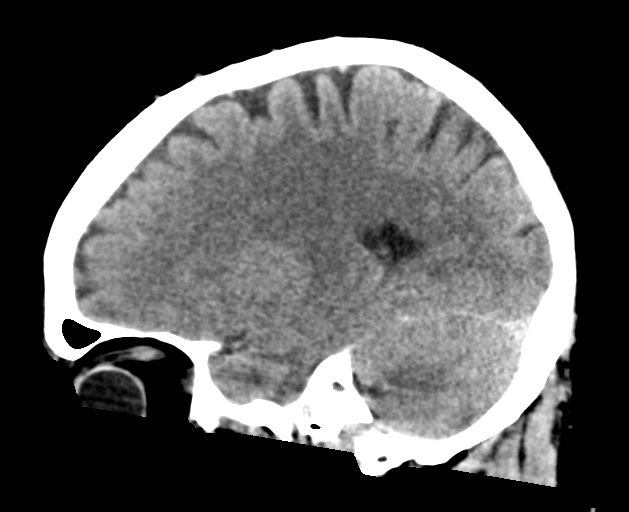
[im 30/59  brain]
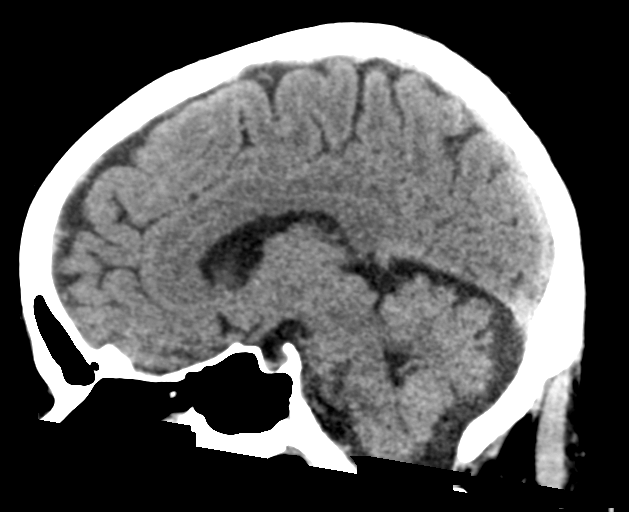
[im 39/59  brain]
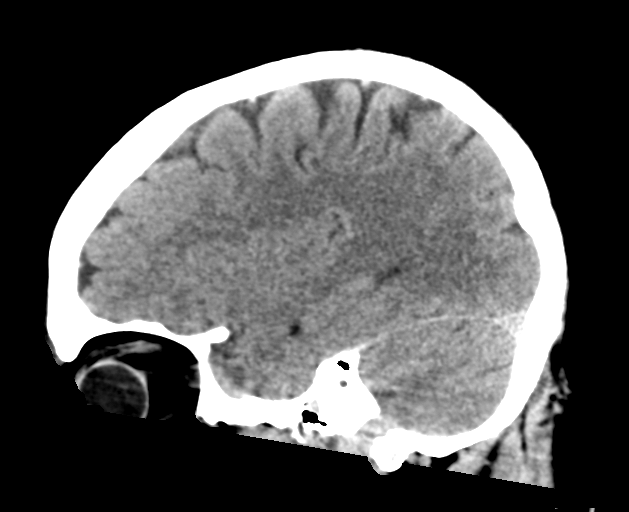

[17 of 47 positions shown; findings below may reference images not displayed]

FINDINGS: Brain: No evidence of acute infarction, hemorrhage, hydrocephalus,
extra-axial collection or mass lesion/mass effect.

Vascular: No hyperdense vessel or unexpected calcification.

Skull: Normal. Negative for fracture or focal lesion.

Sinuses/Orbits: No acute finding.

Other: None.
IMPRESSION: No acute intracranial abnormality noted.

## 2022-05-14 ENCOUNTER — Other Ambulatory Visit: Payer: Self-pay | Admitting: Family Medicine

## 2022-05-14 DIAGNOSIS — I10 Essential (primary) hypertension: Secondary | ICD-10-CM

## 2022-06-04 ENCOUNTER — Other Ambulatory Visit: Payer: Self-pay | Admitting: Family Medicine

## 2022-08-10 ENCOUNTER — Ambulatory Visit: Payer: BC Managed Care – PPO | Admitting: Family Medicine

## 2022-08-13 ENCOUNTER — Other Ambulatory Visit: Payer: Self-pay | Admitting: Family Medicine

## 2022-08-13 DIAGNOSIS — I10 Essential (primary) hypertension: Secondary | ICD-10-CM

## 2022-08-19 ENCOUNTER — Other Ambulatory Visit: Payer: Self-pay | Admitting: Family Medicine

## 2022-09-10 ENCOUNTER — Other Ambulatory Visit: Payer: Self-pay | Admitting: Family Medicine

## 2022-09-10 DIAGNOSIS — I10 Essential (primary) hypertension: Secondary | ICD-10-CM

## 2022-09-11 ENCOUNTER — Ambulatory Visit
Admission: RE | Admit: 2022-09-11 | Discharge: 2022-09-11 | Disposition: A | Discharge status: Home / Self Care | Payer: BC Managed Care – PPO | Source: Ambulatory Visit | Attending: Family Medicine | Admitting: Family Medicine

## 2022-09-11 VITALS — BP 121/81 | HR 75 | Temp 98.3°F | Resp 16

## 2022-09-11 DIAGNOSIS — R197 Diarrhea, unspecified: Secondary | ICD-10-CM

## 2022-09-11 DIAGNOSIS — K529 Noninfective gastroenteritis and colitis, unspecified: Secondary | ICD-10-CM

## 2022-09-11 NOTE — Discharge Instructions (Addendum)
Advised patient to increase daily water intake to 64 ounces per day.  Advised may use Gatorade 0/G2 for cardiac electrolyte replacement.  Advised we will follow-up with lab results once received.  Advised if symptoms worsen and/or unresolved please follow-up with PCP or here for further evaluation.

## 2022-09-11 NOTE — ED Triage Notes (Signed)
Pt presents to [facility] with co of loose stools and abd pain since Saturday. Pt thought he was getting better but symptoms continue

## 2022-09-11 NOTE — ED Provider Notes (Signed)
[facility]    CSN: [id number] Arrival date & time: 09/11/22  1426      History   Chief Complaint Chief Complaint  Patient presents with   Abdominal Pain   Diarrhea    HPI Ethan Bennett is a 62 y.o. male.   HPI 62 year old male presents with loose stools and intermittent abdominal pain for 6 days.  Patient denies abdominal pain currently.  Reports his wife has similar symptoms.  PMH significant for HTN, HLD, and adjustment disorder with anxiety.  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   Adjustment disorder with anxiety 12/22/2021   Sebaceous cyst right buttock 11/21/2021   Umbilical pain 06/24/2021   Umbilical hernia without obstruction and without gangrene 06/24/2021   Family history of aortic aneurysm 06/24/2021   Adjustment disorder with mixed anxiety and depressed mood 03/11/2021   Chronic mastoiditis of left side 02/06/2021   History of ear surgery 02/06/2021   Hyperlipidemia 07/25/2020   Hearing loss 08/09/2018   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 Authorizing Provider  betamethasone, augmented, (DIPROLENE) 0.05 % lotion Apply topically 2 (two) times daily as needed. 03/09/22   Agapito Games, MD  atorvastatin (LIPITOR) 40 MG tablet TAKE 1 TABLET BY MOUTH AT BEDTIME 04/02/22   Agapito Games, MD  Azelastine HCl 137 MCG/SPRAY SOLN PLACE 2 SPRAYS INTO  THE NOSE 2 (TWO) TIMES DAILY. 03/18/22   Agapito Games, MD  finasteride (PROSCAR) 5 MG tablet TAKE 1 TABLET (5 MG TOTAL) BY MOUTH DAILY. 08/20/22   Agapito Games, MD  lisinopril (ZESTRIL) 40 MG tablet Take 1 tablet (40 mg total) by mouth daily. Needs appointment. 09/10/22   Agapito Games, MD  RESTASIS 0.05 % ophthalmic emulsion  11/12/19   [provider]  valACYclovir (VALTREX) 1000 MG tablet Take 1 tablet (1,000 mg total) by mouth 2 (two) times daily as needed. 02/19/22   Agapito Games, MD    Family History Family History  Problem Relation Age of Onset   Hypertension Mother    Cancer - Other Father        biliary   Hypertension Sister    Hypertension Brother    Hypertension Brother    Melanoma Brother     Social History Social History   Tobacco Use   Smoking status: Former    Current packs/day: 0.00    Types: Cigarettes    Quit date: 01/07/1981    Years since quitting: 41.7   Smokeless tobacco: Former  Substance Use Topics   Alcohol use: Yes    Alcohol/week: 6.0 standard drinks of alcohol    Types: 2 Glasses of wine, 2 Cans of beer, 2 Shots of liquor per week    Comment: 7-8 drinks a week of beer/wine or liquor   Drug  use: No     Allergies   Patient has no known allergies.   Review of Systems Review of Systems  Gastrointestinal:  Positive for diarrhea.  All other systems reviewed and are negative.    Physical Exam Triage Vital Signs ED Triage Vitals  Encounter Vitals Group     BP 09/11/22 1442 121/81     Systolic BP Percentile --      Diastolic BP Percentile --      Pulse Rate 09/11/22 1442 75     Resp 09/11/22 1442 16     Temp 09/11/22 1442 98.3 F (36.8 C)     Temp src --      SpO2 09/11/22 1442 98 %     Weight --      Height --      Head Circumference --      Peak Flow --      Pain Score 09/11/22 1441 0     Pain Loc --      Pain Education --      Exclude from Growth Chart --    No data found.  Updated  Vital Signs BP 121/81   Pulse 75   Temp 98.3 F (36.8 C)   Resp 16   SpO2 98%    Physical Exam Vitals and nursing note reviewed.  Constitutional:      Appearance: Normal appearance. He is normal weight.  HENT:     Head: Normocephalic and atraumatic.     Mouth/Throat:     Mouth: Mucous membranes are moist.     Pharynx: Oropharynx is clear.  Eyes:     Extraocular Movements: Extraocular movements intact.     Conjunctiva/sclera: Conjunctivae normal.     Pupils: Pupils are equal, round, and reactive to light.  Cardiovascular:     Rate and Rhythm: Normal rate and regular rhythm.     Pulses: Normal pulses.     Heart sounds: Normal heart sounds.  Pulmonary:     Effort: Pulmonary effort is normal.     Breath sounds: Normal breath sounds. No wheezing, rhonchi or rales.  Musculoskeletal:        General: Normal range of motion.     Cervical back: Normal range of motion and neck supple.  Skin:    General: Skin is warm and dry.  Neurological:     General: No focal deficit present.     Mental Status: He is alert and oriented to person, place, and time. Mental status is at baseline.  Psychiatric:        Mood and Affect: Mood normal.        Behavior: Behavior normal.        Thought Content: Thought content normal.      UC Treatments / Results  Labs (all labs ordered are listed, but only abnormal results are displayed) Labs Reviewed  CBC WITH DIFFERENTIAL/PLATELET    EKG   Radiology No results found.  Procedures Procedures (including critical care time)  Medications Ordered in UC Medications - No data to display  Initial Impression / Assessment and Plan / UC Course  I have reviewed the triage vital signs and the nursing notes.  Pertinent labs & imaging results that were available during my care of the patient were reviewed by me and considered in my medical decision making (see chart for details).     MDM: 1.  Diarrhea, unspecified type-CBC with differential  ordered. Advised patient to increase daily water intake to 64 ounces per day.  2.  Gastroenteritis-CBC with differential ordered.  Advised may use Gatorade 0/G2 for cardiac electrolyte replacement.  Advised we will follow-up with lab results once received.  Patient discharged home, hemodynamically stable.  Final Clinical Impressions(s) / UC Diagnoses   Final diagnoses:  Diarrhea, unspecified type  Gastroenteritis     Discharge Instructions      Advised patient to increase daily water intake to 64 ounces per day.  Advised may use Gatorade 0/G2 for cardiac electrolyte replacement.  Advised we will follow-up with lab results once received.  Advised if symptoms worsen and/or unresolved please follow-up with PCP or here for further evaluation.     ED Prescriptions   None    PDMP not reviewed this encounter.   Trevor Iha, FNP 09/11/22 1611

## 2022-09-12 LAB — CBC WITH DIFFERENTIAL/PLATELET
Basophils Absolute: 0.1 10*3/uL (ref 0.0–0.2)
Basos: 1 %
EOS (ABSOLUTE): 0.2 10*3/uL (ref 0.0–0.4)
Eos: 3 %
Hematocrit: 43.6 % (ref 37.5–51.0)
Hemoglobin: 14.2 g/dL (ref 13.0–17.7)
Immature Grans (Abs): 0 10*3/uL (ref 0.0–0.1)
Immature Granulocytes: 0 %
Lymphocytes Absolute: 1.6 10*3/uL (ref 0.7–3.1)
Lymphs: 23 %
MCH: 30 pg (ref 26.6–33.0)
MCHC: 32.6 g/dL (ref 31.5–35.7)
MCV: 92 fL (ref 79–97)
Monocytes Absolute: 1.1 10*3/uL — ABNORMAL HIGH (ref 0.1–0.9)
Monocytes: 16 %
Neutrophils Absolute: 3.9 10*3/uL (ref 1.4–7.0)
Neutrophils: 57 %
Platelets: 209 10*3/uL (ref 150–450)
RBC: 4.74 x10E6/uL (ref 4.14–5.80)
RDW: 12.7 % (ref 11.6–15.4)
WBC: 6.8 10*3/uL (ref 3.4–10.8)

## 2022-09-14 ENCOUNTER — Ambulatory Visit: Payer: BC Managed Care – PPO | Admitting: Family Medicine

## 2022-09-14 ENCOUNTER — Encounter: Payer: Self-pay | Admitting: Family Medicine

## 2022-09-14 VITALS — BP 117/82 | HR 83 | Resp 16 | Ht 71.0 in | Wt 202.0 lb

## 2022-09-14 DIAGNOSIS — R1084 Generalized abdominal pain: Secondary | ICD-10-CM

## 2022-09-14 DIAGNOSIS — R197 Diarrhea, unspecified: Secondary | ICD-10-CM | POA: Diagnosis not present

## 2022-09-14 DIAGNOSIS — R509 Fever, unspecified: Secondary | ICD-10-CM | POA: Diagnosis not present

## 2022-09-14 MED ORDER — AMOXICILLIN-POT CLAVULANATE 875-125 MG PO TABS: 1.0000 | ORAL_TABLET | Freq: Two times a day (BID) | ORAL | 0 refills | Status: AC

## 2022-09-14 NOTE — Assessment & Plan Note (Signed)
-   persistent diarrhea for two weeks will go ahead and do stool studies  - discussed likely this is infectious  - recommend hydration and continuing bland foods

## 2022-09-14 NOTE — Assessment & Plan Note (Signed)
-   will get cmv for cyclical fevers. Pt says he fevers daily in the afternoon

## 2022-09-14 NOTE — Progress Notes (Signed)
Acute Office Visit  Subjective:     Patient ID: Ethan Bennett, male    DOB: [birth year], 62 y.o.   MRN: [id number]  Chief Complaint  Patient presents with   Diarrhea   Headache    Pt states he has had these symptoms since fri 09/11/22    HPI Patient is in today for diarrhea, fever, and abdominal pain. Abdominal pain has been present for two weeks now. Describes diarrhea as watery. Admits to headaches and cyclic fevers. Symptoms started on 09/04/22 about 12 hours after eating a frozen salmon and a veggie from their yard that was washed and placed in the fridge. Wife has similar symptoms but are not as severe.   Review of Systems  Constitutional:  Negative for chills and fever.  Respiratory:  Negative for cough and shortness of breath.   Cardiovascular:  Negative for chest pain.  Neurological:  Negative for headaches.      Objective:    BP 117/82 (BP Location: Left Arm, Patient Position: Sitting, Cuff Size: Normal)   Pulse 83   Resp 16   Ht 5\' 11"  (1.803 m)   Wt 202 lb (91.6 kg)   SpO2 95%   BMI 28.17 kg/m    Physical Exam Vitals and nursing note reviewed.  Constitutional:      General: He is not in acute distress.    Appearance: Normal appearance.  HENT:     Head: Normocephalic and atraumatic.     Right Ear: External ear normal.     Left Ear: External ear normal.     Nose: Nose normal.  Eyes:     Conjunctiva/sclera: Conjunctivae normal.  Cardiovascular:     Rate and Rhythm: Normal rate and regular rhythm.  Pulmonary:     Effort: Pulmonary effort is normal.     Breath sounds: Normal breath sounds.  Abdominal:     General: Bowel sounds are normal. There is no distension.     Palpations: Abdomen is soft.  Neurological:     General: No focal deficit present.     Mental Status: He is alert and oriented to person, place, and time.  Psychiatric:        Mood and Affect: Mood normal.        Behavior: Behavior normal.        Thought Content: Thought content  normal.        Judgment: Judgment normal.     No results found for any visits on 09/14/22.      Assessment & Plan:   Problem List Items Addressed This Visit       Other   Diarrhea - Primary    - persistent diarrhea for two weeks will go ahead and do stool studies  - discussed likely this is infectious  - recommend hydration and continuing bland foods      Relevant Orders   BASIC METABOLIC PANEL WITH GFR   Giardia antigen   Lipase   Fecal lactoferrin, quant   C. difficile GDH and Toxin A/B   Sedimentation rate   Salmonella/Shigella Cult, Campy EIA and Shiga Toxin reflex   Generalized abdominal pain    - xray abdomen done to assess for intra abdominal pathology - pt had diverticula on colonoscopy 6 years ago. Will go ahead and give augmentin to treat for diverticulitis as well as intraabdominal pathology      Relevant Orders   DG Abd 1 View   Fever, intermittent    - will get cmv  for cyclical fevers. Pt says he fevers daily in the afternoon      Relevant Orders   CMV abs, IgG+IgM (cytomegalovirus)    Meds ordered this encounter  Medications   amoxicillin-clavulanate (AUGMENTIN) 875-125 MG tablet    Sig: Take 1 tablet by mouth 2 (two) times daily for 10 days.    Dispense:  20 tablet    Refill:  0    Return in about 1 week (around 09/21/2022) for with PCP.  Charlton Amor, DO

## 2022-09-14 NOTE — Assessment & Plan Note (Signed)
-   xray abdomen done to assess for intra abdominal pathology - pt had diverticula on colonoscopy 6 years ago. Will go ahead and give augmentin to treat for diverticulitis as well as intraabdominal pathology

## 2022-09-15 ENCOUNTER — Ambulatory Visit: Payer: BC Managed Care – PPO

## 2022-09-15 ENCOUNTER — Encounter: Payer: Self-pay | Admitting: Family Medicine

## 2022-09-15 DIAGNOSIS — R509 Fever, unspecified: Secondary | ICD-10-CM | POA: Diagnosis not present

## 2022-09-15 DIAGNOSIS — R1084 Generalized abdominal pain: Secondary | ICD-10-CM | POA: Diagnosis not present

## 2022-09-15 DIAGNOSIS — R197 Diarrhea, unspecified: Secondary | ICD-10-CM | POA: Diagnosis not present

## 2022-09-15 DIAGNOSIS — Z0389 Encounter for observation for other suspected diseases and conditions ruled out: Secondary | ICD-10-CM | POA: Diagnosis not present

## 2022-09-15 LAB — LIPASE: Lipase: 23 U/L (ref 7–60)

## 2022-09-15 LAB — BASIC METABOLIC PANEL WITH GFR
BUN: 10 mg/dL (ref 7–25)
CO2: 28 mmol/L (ref 20–32)
Calcium: 9.1 mg/dL (ref 8.6–10.3)
Chloride: 105 mmol/L (ref 98–110)
Creat: 0.91 mg/dL (ref 0.70–1.35)
Glucose, Bld: 99 mg/dL (ref 65–99)
Potassium: 4.4 mmol/L (ref 3.5–5.3)
Sodium: 140 mmol/L (ref 135–146)
eGFR: 95 mL/min/{1.73_m2} (ref >=60)

## 2022-09-15 LAB — CMV ABS, IGG+IGM (CYTOMEGALOVIRUS)
CMV IgM: <30 AU/mL
Cytomegalovirus Ab-IgG: <0.6 U/mL

## 2022-09-15 LAB — SEDIMENTATION RATE: Sed Rate: 11 mm/h (ref 0–20)

## 2022-09-16 ENCOUNTER — Encounter: Payer: Self-pay | Admitting: Family Medicine

## 2022-09-17 ENCOUNTER — Telehealth: Payer: Self-pay | Admitting: Family Medicine

## 2022-09-17 NOTE — Telephone Encounter (Signed)
Patient called and asked if he needs to keep his appt for Monday or cancel and go to a Gastrologist  Please advise

## 2022-09-18 ENCOUNTER — Other Ambulatory Visit: Payer: Self-pay | Admitting: Family Medicine

## 2022-09-18 ENCOUNTER — Other Ambulatory Visit: Payer: Self-pay

## 2022-09-18 LAB — C. DIFFICILE GDH AND TOXIN A/B
GDH ANTIGEN: NOT DETECTED
MICRO NUMBER:: 15208783
SPECIMEN QUALITY:: ADEQUATE
TOXIN A AND B: NOT DETECTED

## 2022-09-18 LAB — SALMONELLA/SHIGELLA CULT, CAMPY EIA AND SHIGA TOXIN RFL ECOLI
MICRO NUMBER: 15207713
MICRO NUMBER:: 15207714
MICRO NUMBER:: 15207716
Result:: NOT DETECTED
SHIGA RESULT:: NOT DETECTED
SPECIMEN QUALITY: ADEQUATE
SPECIMEN QUALITY:: ADEQUATE
SPECIMEN QUALITY:: ADEQUATE

## 2022-09-18 LAB — GIARDIA ANTIGEN
MICRO NUMBER:: 15208663
RESULT:: NOT DETECTED
SPECIMEN QUALITY:: ADEQUATE

## 2022-09-18 LAB — FECAL LACTOFERRIN, QUANT
Fecal Lactoferrin: NEGATIVE
MICRO NUMBER:: 15209744
SPECIMEN QUALITY:: ADEQUATE

## 2022-09-18 MED ORDER — CIPROFLOXACIN HCL 500 MG PO TABS: 500.0000 mg | ORAL_TABLET | Freq: Two times a day (BID) | ORAL | 0 refills | Status: AC

## 2022-09-18 NOTE — Telephone Encounter (Signed)
Attempted call to patient. Left a voice mail message requesting a return call.  

## 2022-09-18 NOTE — Telephone Encounter (Signed)
Patient informed. 

## 2022-09-18 NOTE — Telephone Encounter (Signed)
Yes GI

## 2022-09-18 NOTE — Telephone Encounter (Signed)
Patient called back - needing records sent to [facility]  so can schedule. Pended referral.

## 2022-09-21 ENCOUNTER — Other Ambulatory Visit: Payer: Self-pay | Admitting: Family Medicine

## 2022-09-21 ENCOUNTER — Other Ambulatory Visit: Payer: Self-pay

## 2022-09-21 DIAGNOSIS — I10 Essential (primary) hypertension: Secondary | ICD-10-CM

## 2022-09-21 DIAGNOSIS — R1084 Generalized abdominal pain: Secondary | ICD-10-CM

## 2022-09-21 DIAGNOSIS — R197 Diarrhea, unspecified: Secondary | ICD-10-CM

## 2022-09-21 NOTE — Telephone Encounter (Signed)
Pt called office requesting referral to GI asap. Pt states he has continued to have diarrhea 6 times a day, feels light headed, has some headaches and unable to keep hydrated. Pt states he was able to schedule an appointment on 10/02/22 with [facility] through [facility].

## 2022-09-21 NOTE — Telephone Encounter (Signed)
Pt to schedule with Dr. Linford Arnold for HTN follow-up. Sending 30 day med refill.

## 2022-09-21 NOTE — Telephone Encounter (Signed)
Pt aware of appointment with [facility]. Can you please enter referral so I can send info?

## 2022-09-21 NOTE — Telephone Encounter (Signed)
Called [facility], scheduled an appointment 09/24/2022 at 10:15am with Dr. Lance Morin. Appointment is located in [facility] office.

## 2022-09-22 ENCOUNTER — Ambulatory Visit: Payer: BC Managed Care – PPO | Admitting: Family Medicine

## 2022-09-22 ENCOUNTER — Other Ambulatory Visit: Payer: Self-pay | Admitting: Family Medicine

## 2022-09-22 DIAGNOSIS — R1084 Generalized abdominal pain: Secondary | ICD-10-CM

## 2022-09-22 DIAGNOSIS — R197 Diarrhea, unspecified: Secondary | ICD-10-CM

## 2022-09-22 MED ORDER — CHOLESTYRAMINE LIGHT 4 G PO PACK
4.0000 g | PACK | Freq: Two times a day (BID) | ORAL | 0 refills | Status: DC
Start: 2022-09-22 — End: 2023-06-15

## 2022-09-22 NOTE — Telephone Encounter (Signed)
Patient scheduled for 10/22/22, thanks.

## 2022-09-22 NOTE — Telephone Encounter (Signed)
Called patient, LVM to call office to schedule HTN follow up, thanks.

## 2022-09-22 NOTE — Telephone Encounter (Signed)
Referral, clinical notes, and copies of insurance cards have been faxed to [facility] at [phone]. Office will contact patient to schedule referral appointment.

## 2022-09-24 DIAGNOSIS — R198 Other specified symptoms and signs involving the digestive system and abdomen: Secondary | ICD-10-CM | POA: Diagnosis not present

## 2022-09-24 DIAGNOSIS — A09 Infectious gastroenteritis and colitis, unspecified: Secondary | ICD-10-CM | POA: Diagnosis not present

## 2022-09-24 DIAGNOSIS — R109 Unspecified abdominal pain: Secondary | ICD-10-CM | POA: Diagnosis not present

## 2022-09-24 DIAGNOSIS — R140 Abdominal distension (gaseous): Secondary | ICD-10-CM | POA: Diagnosis not present

## 2022-09-29 ENCOUNTER — Other Ambulatory Visit: Payer: Self-pay | Admitting: Family Medicine

## 2022-09-29 ENCOUNTER — Encounter: Payer: Self-pay | Admitting: Family Medicine

## 2022-09-29 DIAGNOSIS — R197 Diarrhea, unspecified: Secondary | ICD-10-CM

## 2022-09-30 ENCOUNTER — Other Ambulatory Visit: Payer: Self-pay | Admitting: Family Medicine

## 2022-09-30 DIAGNOSIS — J309 Allergic rhinitis, unspecified: Secondary | ICD-10-CM

## 2022-10-05 ENCOUNTER — Ambulatory Visit (INDEPENDENT_AMBULATORY_CARE_PROVIDER_SITE_OTHER): Payer: BC Managed Care – PPO

## 2022-10-05 DIAGNOSIS — K5730 Diverticulosis of large intestine without perforation or abscess without bleeding: Secondary | ICD-10-CM | POA: Diagnosis not present

## 2022-10-05 DIAGNOSIS — R1084 Generalized abdominal pain: Secondary | ICD-10-CM

## 2022-10-05 DIAGNOSIS — R197 Diarrhea, unspecified: Secondary | ICD-10-CM

## 2022-10-05 DIAGNOSIS — R1032 Left lower quadrant pain: Secondary | ICD-10-CM | POA: Diagnosis not present

## 2022-10-05 MED ORDER — IOHEXOL 300 MG/ML  SOLN
100.0000 mL | Freq: Once | INTRAMUSCULAR | Status: AC | PRN
  Administered 2022-10-05: 100 mL via INTRAVENOUS

## 2022-10-07 ENCOUNTER — Encounter: Payer: Self-pay | Admitting: Family Medicine

## 2022-10-10 ENCOUNTER — Other Ambulatory Visit: Payer: Self-pay | Admitting: Family Medicine

## 2022-10-10 DIAGNOSIS — I10 Essential (primary) hypertension: Secondary | ICD-10-CM

## 2022-10-22 ENCOUNTER — Encounter: Payer: Self-pay | Admitting: Family Medicine

## 2022-10-22 ENCOUNTER — Ambulatory Visit: Payer: BC Managed Care – PPO | Admitting: Family Medicine

## 2022-10-22 VITALS — BP 115/69 | HR 74 | Ht 71.0 in | Wt 200.0 lb

## 2022-10-22 DIAGNOSIS — R197 Diarrhea, unspecified: Secondary | ICD-10-CM

## 2022-10-22 DIAGNOSIS — E785 Hyperlipidemia, unspecified: Secondary | ICD-10-CM | POA: Diagnosis not present

## 2022-10-22 DIAGNOSIS — L659 Nonscarring hair loss, unspecified: Secondary | ICD-10-CM

## 2022-10-22 DIAGNOSIS — I10 Essential (primary) hypertension: Secondary | ICD-10-CM | POA: Diagnosis not present

## 2022-10-22 NOTE — Progress Notes (Signed)
   Established Patient Office Visit  Subjective   Patient ID: Ethan Bennett, male    DOB: [birth year]  Age: 62 y.o. MRN: [id number]  Chief Complaint  Patient presents with   Hypertension    HPI  Hypertension- Pt denies chest pain, SOB, dizziness, or heart palpitations.  Taking meds as directed w/o problems.  Denies medication side effects.    Has f/u appt in October for chronic diarrhea.  He has been taking cholestyramine and that is helpful.  He will likely undergo scope in October and can get to the bottom of what is going on.  He has been having occasional episodes where he just feels really tired and rundown, even now that the diarrhea is a little better controlled.  ROS    Objective:     BP 115/69   Pulse 74   Ht 5\' 11"  (1.803 m)   Wt 200 lb (90.7 kg)   SpO2 100%   BMI 27.89 kg/m     Physical Exam Vitals and nursing note reviewed.  Constitutional:      Appearance: Normal appearance. He is well-developed.  HENT:     Head: Normocephalic and atraumatic.  Eyes:     Conjunctiva/sclera: Conjunctivae normal.  Cardiovascular:     Rate and Rhythm: Normal rate and regular rhythm.     Heart sounds: Normal heart sounds.  Pulmonary:     Effort: Pulmonary effort is normal.     Breath sounds: Normal breath sounds.  Skin:    General: Skin is warm and dry.  Neurological:     Mental Status: He is alert and oriented to person, place, and time.  Psychiatric:        Mood and Affect: Mood normal.        Behavior: Behavior normal.      No results found for any visits on 10/22/22.     The 10-year ASCVD risk score (Arnett DK, et al., 2019) is: 10.2%    Assessment & Plan:   Problem List Items Addressed This Visit       Cardiovascular and Mediastinum   Essential hypertension - Primary    Did encourage him to keep an eye on his blood pressure since he is having occasional episodes of just feeling really tired.  It could be that his blood pressure is dropping low  especially since p.o. intake and appetite have been down.  Encouraged him to check blood pressure at home just to make sure that it is not causing hypotension and if so he can always split the tab in half if needed.      Relevant Orders   Lipid panel   CMP14+EGFR     Other   Hyperlipidemia    Due for updated lipid panel in October.  Lab slip given today since I will not see him back for 6 months.      Hair loss    Definitely notices less hair shedding on the finasteride.      Diarrhea    Or other evaluation in October.  Continue with cholestyramine.       Return in about 6 months (around 05/03/2023) for Hypertension.    Nani Gasser, MD

## 2022-10-22 NOTE — Assessment & Plan Note (Signed)
Did encourage him to keep an eye on his blood pressure since he is having occasional episodes of just feeling really tired.  It could be that his blood pressure is dropping low especially since p.o. intake and appetite have been down.  Encouraged him to check blood pressure at home just to make sure that it is not causing hypotension and if so he can always split the tab in half if needed.

## 2022-10-22 NOTE — Assessment & Plan Note (Signed)
Or other evaluation in October.  Continue with cholestyramine.

## 2022-10-22 NOTE — Assessment & Plan Note (Signed)
Due for updated lipid panel in October.  Lab slip given today since I will not see him back for 6 months.

## 2022-10-22 NOTE — Assessment & Plan Note (Signed)
Definitely notices less hair shedding on the finasteride.

## 2022-10-29 DIAGNOSIS — H43812 Vitreous degeneration, left eye: Secondary | ICD-10-CM | POA: Diagnosis not present

## 2022-10-29 DIAGNOSIS — H43821 Vitreomacular adhesion, right eye: Secondary | ICD-10-CM | POA: Diagnosis not present

## 2022-10-29 DIAGNOSIS — D3131 Benign neoplasm of right choroid: Secondary | ICD-10-CM | POA: Diagnosis not present

## 2022-11-03 DIAGNOSIS — R197 Diarrhea, unspecified: Secondary | ICD-10-CM | POA: Diagnosis not present

## 2022-11-03 DIAGNOSIS — R1032 Left lower quadrant pain: Secondary | ICD-10-CM | POA: Diagnosis not present

## 2022-11-05 DIAGNOSIS — R109 Unspecified abdominal pain: Secondary | ICD-10-CM | POA: Diagnosis not present

## 2022-11-12 ENCOUNTER — Encounter: Payer: Self-pay | Admitting: Family Medicine

## 2022-11-13 DIAGNOSIS — I10 Essential (primary) hypertension: Secondary | ICD-10-CM | POA: Diagnosis not present

## 2022-11-14 LAB — CMP14+EGFR
ALT: 54 IU/L — ABNORMAL HIGH (ref 0–44)
AST: 31 IU/L (ref 0–40)
Albumin: 4.2 g/dL (ref 3.9–4.9)
Alkaline Phosphatase: 109 IU/L (ref 44–121)
BUN/Creatinine Ratio: 15 (ref 10–24)
BUN: 16 mg/dL (ref 8–27)
Bilirubin Total: 0.5 mg/dL (ref 0.0–1.2)
CO2: 23 mmol/L (ref 20–29)
Calcium: 9 mg/dL (ref 8.6–10.2)
Chloride: 104 mmol/L (ref 96–106)
Creatinine, Ser: 1.08 mg/dL (ref 0.76–1.27)
Globulin, Total: 2.2 g/dL (ref 1.5–4.5)
Glucose: 99 mg/dL (ref 70–99)
Potassium: 4.6 mmol/L (ref 3.5–5.2)
Sodium: 140 mmol/L (ref 134–144)
Total Protein: 6.4 g/dL (ref 6.0–8.5)
eGFR: 78 mL/min/{1.73_m2} (ref >59)

## 2022-11-14 LAB — LIPID PANEL
Chol/HDL Ratio: 2.3 ratio (ref 0.0–5.0)
Cholesterol, Total: 136 mg/dL (ref 100–199)
HDL: 59 mg/dL (ref >39)
LDL Chol Calc (NIH): 62 mg/dL (ref 0–99)
Triglycerides: 77 mg/dL (ref 0–149)
VLDL Cholesterol Cal: 15 mg/dL (ref 5–40)

## 2022-11-16 NOTE — Progress Notes (Signed)
Hi Ethan Bennett, overall your metabolic panel looks good though your ALT which is one of your liver enzymes is up just slightly.  I would like to recheck this in 3 to 4 weeks.  Just make sure you are avoiding any excess Tylenol products.  Cholesterol overall looks good.

## 2022-11-25 ENCOUNTER — Encounter: Payer: Self-pay | Admitting: Family Medicine

## 2022-11-25 ENCOUNTER — Telehealth: Payer: BC Managed Care – PPO | Admitting: Family Medicine

## 2022-11-25 VITALS — Temp 100.6°F

## 2022-11-25 DIAGNOSIS — R051 Acute cough: Secondary | ICD-10-CM | POA: Diagnosis not present

## 2022-11-25 DIAGNOSIS — U071 COVID-19: Secondary | ICD-10-CM | POA: Insufficient documentation

## 2022-11-25 MED ORDER — NIRMATRELVIR/RITONAVIR (PAXLOVID)TABLET
3.0000 | ORAL_TABLET | Freq: Two times a day (BID) | ORAL | 0 refills | Status: AC
Start: 2022-11-25 — End: 2022-11-30

## 2022-11-25 NOTE — Telephone Encounter (Signed)
Pt seen today

## 2022-11-25 NOTE — Assessment & Plan Note (Signed)
Will use OTC medications to control cough.

## 2022-11-25 NOTE — Progress Notes (Signed)
Established Patient Office Visit I connected with  Ethan Bennett on 11/25/22 by a video enabled telemedicine application and verified that I am speaking with the correct person using two identifiers.   I discussed the limitations of evaluation and management by telemedicine. The patient expressed understanding and agreed to proceed. Virtual Visit via Video Note  I connected with Ethan Bennett on 11/25/22 at  9:10 AM EDT by a video enabled telemedicine application and verified that I am speaking with the correct person using two identifiers.  Location: Patient: home Provider: office    I discussed the limitations of evaluation and management by telemedicine and the availability of in person appointments. The patient expressed understanding and agreed to proceed.  History of Present Illness:    Observations/Objective:   Assessment and Plan:   Follow Up Instructions:    I discussed the assessment and treatment plan with the patient. The patient was provided an opportunity to ask questions and all were answered. The patient agreed with the plan and demonstrated an understanding of the instructions.   The patient was advised to call back or seek an in-person evaluation if the symptoms worsen or if the condition fails to improve as anticipated.     Novella Olive, FNP  Subjective   Patient ID: Ethan Bennett, male    DOB: 1960-04-23  Age: 62 y.o. MRN: [id number]  Chief Complaint  Patient presents with   Covid Positive    HPI  Presents today for an acute visit with complaint of tested positive for Covid today Symptoms have been present  since yesterday around 4-5 pm  Associated symptoms include: headache, sore throat, swollen glands, 100.6, cough with sputum, tinnitus worse now with Covid.  Pertinent negatives: no shortness of breath  Pain severity: 8/10 headache  Treatments tried include : motrin for headache Treatment effective : has not had time to  work Sick contacts : none, lives with wife who is not ill.    Review of Systems  Constitutional:  Positive for fever and malaise/fatigue. Negative for chills.  HENT:  Positive for congestion, sore throat and tinnitus.   Respiratory:  Positive for cough and sputum production. Negative for shortness of breath.   Cardiovascular:  Negative for chest pain.      Objective:     Temp (!) 100.6 F (38.1 C)  BP Readings from Last 3 Encounters:  10/22/22 115/69  09/14/22 117/82  09/11/22 121/81      Physical Exam Vitals reviewed.  Constitutional:      General: He is not in acute distress.    Appearance: He is ill-appearing.  Pulmonary:     Effort: Pulmonary effort is normal.  Skin:    General: Skin is warm and dry.  Neurological:     General: No focal deficit present.     Mental Status: He is alert. Mental status is at baseline.  Psychiatric:        Behavior: Behavior normal.        Thought Content: Thought content normal.     No results found for any visits on 11/25/22.    The 10-year ASCVD risk score (Arnett DK, et al., 2019) is: 10.1%    Assessment & Plan:   Problem List Items Addressed This Visit     COVID-19 virus infection - Primary    Symptoms present since yesterday: headache, fever, sore throat, swollen glands, cough, tinnitus (chronic worse than normal), and fatigue. Paxlovid BID prescribed. Will hold atorvastatin during treatment.  GFR 78 in September. No shortness of breath. Supportive therapy. Ibuprofen for headache according to package directions.  Reviewed CDC isolation guidelines. Information sent via My Chart.       Relevant Medications   nirmatrelvir/ritonavir (PAXLOVID) 20 x 150 MG & 10 x 100MG  TABS   Acute cough    Will use OTC medications to control cough.       Agrees with plan of care discussed.  Questions answered.   Return if symptoms worsen or fail to improve.    Novella Olive, FNP

## 2022-11-25 NOTE — Assessment & Plan Note (Addendum)
Symptoms present since yesterday: headache, fever, sore throat, swollen glands, cough, tinnitus (chronic worse than normal), and fatigue. Paxlovid BID prescribed. Will hold atorvastatin during treatment. GFR 78 in September. No shortness of breath. Supportive therapy. Ibuprofen for headache according to package directions.  Reviewed CDC isolation guidelines. Information sent via My Chart.

## 2022-11-26 ENCOUNTER — Encounter: Payer: Self-pay | Admitting: Family Medicine

## 2022-12-03 DIAGNOSIS — M25561 Pain in right knee: Secondary | ICD-10-CM | POA: Diagnosis not present

## 2022-12-08 DIAGNOSIS — M25561 Pain in right knee: Secondary | ICD-10-CM | POA: Diagnosis not present

## 2022-12-15 DIAGNOSIS — M25561 Pain in right knee: Secondary | ICD-10-CM | POA: Diagnosis not present

## 2022-12-22 DIAGNOSIS — M25561 Pain in right knee: Secondary | ICD-10-CM | POA: Diagnosis not present

## 2023-01-05 DIAGNOSIS — M25561 Pain in right knee: Secondary | ICD-10-CM | POA: Diagnosis not present

## 2023-03-11 ENCOUNTER — Encounter: Payer: Self-pay | Admitting: Family Medicine

## 2023-03-11 MED ORDER — BETAMETHASONE DIPROPIONATE AUG 0.05 % EX LOTN: TOPICAL_LOTION | Freq: Two times a day (BID) | CUTANEOUS | 1 refills | Status: AC | PRN

## 2023-03-24 ENCOUNTER — Other Ambulatory Visit: Payer: Self-pay | Admitting: Family Medicine

## 2023-03-24 DIAGNOSIS — M79622 Pain in left upper arm: Secondary | ICD-10-CM | POA: Diagnosis not present

## 2023-03-24 DIAGNOSIS — M79632 Pain in left forearm: Secondary | ICD-10-CM | POA: Diagnosis not present

## 2023-03-24 DIAGNOSIS — E785 Hyperlipidemia, unspecified: Secondary | ICD-10-CM

## 2023-03-30 DIAGNOSIS — M7712 Lateral epicondylitis, left elbow: Secondary | ICD-10-CM | POA: Diagnosis not present

## 2023-03-30 DIAGNOSIS — M6281 Muscle weakness (generalized): Secondary | ICD-10-CM | POA: Diagnosis not present

## 2023-03-30 DIAGNOSIS — M79602 Pain in left arm: Secondary | ICD-10-CM | POA: Diagnosis not present

## 2023-04-01 DIAGNOSIS — M6281 Muscle weakness (generalized): Secondary | ICD-10-CM | POA: Diagnosis not present

## 2023-04-01 DIAGNOSIS — M79602 Pain in left arm: Secondary | ICD-10-CM | POA: Diagnosis not present

## 2023-04-01 DIAGNOSIS — M7712 Lateral epicondylitis, left elbow: Secondary | ICD-10-CM | POA: Diagnosis not present

## 2023-04-06 DIAGNOSIS — M79602 Pain in left arm: Secondary | ICD-10-CM | POA: Diagnosis not present

## 2023-04-06 DIAGNOSIS — M7712 Lateral epicondylitis, left elbow: Secondary | ICD-10-CM | POA: Diagnosis not present

## 2023-04-06 DIAGNOSIS — M6281 Muscle weakness (generalized): Secondary | ICD-10-CM | POA: Diagnosis not present

## 2023-04-08 DIAGNOSIS — M79602 Pain in left arm: Secondary | ICD-10-CM | POA: Diagnosis not present

## 2023-04-08 DIAGNOSIS — M6281 Muscle weakness (generalized): Secondary | ICD-10-CM | POA: Diagnosis not present

## 2023-04-08 DIAGNOSIS — M7712 Lateral epicondylitis, left elbow: Secondary | ICD-10-CM | POA: Diagnosis not present

## 2023-04-10 ENCOUNTER — Other Ambulatory Visit: Payer: Self-pay | Admitting: Family Medicine

## 2023-04-10 DIAGNOSIS — I10 Essential (primary) hypertension: Secondary | ICD-10-CM

## 2023-04-13 DIAGNOSIS — M79602 Pain in left arm: Secondary | ICD-10-CM | POA: Diagnosis not present

## 2023-04-13 DIAGNOSIS — M7712 Lateral epicondylitis, left elbow: Secondary | ICD-10-CM | POA: Diagnosis not present

## 2023-04-13 DIAGNOSIS — M6281 Muscle weakness (generalized): Secondary | ICD-10-CM | POA: Diagnosis not present

## 2023-04-15 DIAGNOSIS — M79602 Pain in left arm: Secondary | ICD-10-CM | POA: Diagnosis not present

## 2023-04-15 DIAGNOSIS — M6281 Muscle weakness (generalized): Secondary | ICD-10-CM | POA: Diagnosis not present

## 2023-04-15 DIAGNOSIS — M7712 Lateral epicondylitis, left elbow: Secondary | ICD-10-CM | POA: Diagnosis not present

## 2023-04-20 DIAGNOSIS — M79602 Pain in left arm: Secondary | ICD-10-CM | POA: Diagnosis not present

## 2023-04-20 DIAGNOSIS — M7712 Lateral epicondylitis, left elbow: Secondary | ICD-10-CM | POA: Diagnosis not present

## 2023-04-20 DIAGNOSIS — M6281 Muscle weakness (generalized): Secondary | ICD-10-CM | POA: Diagnosis not present

## 2023-04-22 DIAGNOSIS — M7712 Lateral epicondylitis, left elbow: Secondary | ICD-10-CM | POA: Diagnosis not present

## 2023-04-22 DIAGNOSIS — M79602 Pain in left arm: Secondary | ICD-10-CM | POA: Diagnosis not present

## 2023-04-22 DIAGNOSIS — M6281 Muscle weakness (generalized): Secondary | ICD-10-CM | POA: Diagnosis not present

## 2023-04-26 DIAGNOSIS — M79602 Pain in left arm: Secondary | ICD-10-CM | POA: Diagnosis not present

## 2023-04-26 DIAGNOSIS — M6281 Muscle weakness (generalized): Secondary | ICD-10-CM | POA: Diagnosis not present

## 2023-04-26 DIAGNOSIS — M7712 Lateral epicondylitis, left elbow: Secondary | ICD-10-CM | POA: Diagnosis not present

## 2023-04-28 DIAGNOSIS — M7712 Lateral epicondylitis, left elbow: Secondary | ICD-10-CM | POA: Diagnosis not present

## 2023-04-28 DIAGNOSIS — M6281 Muscle weakness (generalized): Secondary | ICD-10-CM | POA: Diagnosis not present

## 2023-04-28 DIAGNOSIS — M79602 Pain in left arm: Secondary | ICD-10-CM | POA: Diagnosis not present

## 2023-05-03 ENCOUNTER — Ambulatory Visit: Payer: BC Managed Care – PPO | Admitting: Family Medicine

## 2023-06-15 ENCOUNTER — Encounter: Payer: Self-pay | Admitting: Family Medicine

## 2023-06-15 MED ORDER — FINASTERIDE 5 MG PO TABS: 5.0000 mg | ORAL_TABLET | Freq: Every day | ORAL | 0 refills | Status: DC

## 2023-06-23 ENCOUNTER — Ambulatory Visit
Admission: RE | Admit: 2023-06-23 | Discharge: 2023-06-23 | Disposition: A | Discharge status: Home / Self Care | Source: Ambulatory Visit | Attending: Family Medicine | Admitting: Family Medicine

## 2023-06-23 VITALS — BP 135/89 | HR 94 | Temp 98.4°F | Resp 17

## 2023-06-23 DIAGNOSIS — R509 Fever, unspecified: Secondary | ICD-10-CM

## 2023-06-23 DIAGNOSIS — J069 Acute upper respiratory infection, unspecified: Secondary | ICD-10-CM

## 2023-06-23 DIAGNOSIS — R059 Cough, unspecified: Secondary | ICD-10-CM

## 2023-06-23 LAB — POCT INFLUENZA A/B
Influenza A, POC: NEGATIVE
Influenza B, POC: NEGATIVE

## 2023-06-23 LAB — POC SARS CORONAVIRUS 2 AG -  ED: SARS Coronavirus 2 Ag: NEGATIVE

## 2023-06-23 MED ORDER — AMOXICILLIN-POT CLAVULANATE 875-125 MG PO TABS: 1.0000 | ORAL_TABLET | Freq: Two times a day (BID) | ORAL | 0 refills | Status: DC

## 2023-06-23 MED ORDER — PREDNISONE 20 MG PO TABS: ORAL_TABLET | ORAL | 0 refills | Status: DC

## 2023-06-23 NOTE — ED Triage Notes (Signed)
 Pt c/o cough and nasal congestion x 10 days. Was better for a couple days but sxs returned on Monday. Sinus pressure today as well as 100.4 temp this am. Nyquil prn. Hx of sinus infections.

## 2023-06-23 NOTE — ED Provider Notes (Signed)
 [facility]    CSN: [id number] Arrival date & time: 06/23/23  0931      History   Chief Complaint Chief Complaint  Patient presents with   Nasal Congestion    I was sick for 10 days (runny nose, cough, sinus, etc.) and "better" for 2. I got sick again Monday afternoon far worse than before and now have a 100.4 temp to go along with the sinus issues and serious cough. - Entered by patient   Cough    HPI Ethan Bennett is a 63 y.o. male.   HPI 63 year old male presents with runny nose, cough, and sinus nasal congestion for 10 days.  PMH significant for GERD, HLD, and HTN.  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   Adjustment disorder with anxiety 12/22/2021   Sebaceous cyst right buttock 11/21/2021   Umbilical pain 06/24/2021   Umbilical hernia without obstruction and without gangrene 06/24/2021   Family history of aortic aneurysm 06/24/2021   Adjustment disorder with mixed anxiety and depressed mood 03/11/2021   Chronic mastoiditis of left side 02/06/2021   History of ear surgery 02/06/2021   Hyperlipidemia 07/25/2020   Hearing loss 08/09/2018   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 Authorizing Provider   amoxicillin -clavulanate (AUGMENTIN ) 875-125 MG tablet Take 1 tablet by mouth every 12 (twelve) hours. 06/23/23  Yes Leonides Ramp, FNP  predniSONE  (DELTASONE ) 20 MG tablet Take 3 tabs PO daily x 5 days. 06/23/23  Yes Leonides Ramp, FNP  atorvastatin  (LIPITOR) 40 MG tablet TAKE 1 TABLET BY MOUTH EVERYDAY AT BEDTIME 03/24/23   Cydney Draft, MD  Azelastine  HCl 137 MCG/SPRAY SOLN PLACE 2 SPRAYS INTO THE NOSE 2 (TWO) TIMES DAILY. 09/30/22   Cydney Draft, MD  betamethasone , augmented, (DIPROLENE ) 0.05 % lotion Apply topically 2 (two) times daily as needed. 03/11/23   Cydney Draft, MD  finasteride  (PROSCAR ) 5 MG tablet Take 1 tablet (5 mg total) by mouth daily. 06/15/23   Cydney Draft, MD  lisinopril  (ZESTRIL ) 40 MG tablet TAKE 1 TABLET (40 MG TOTAL) BY MOUTH DAILY. NEEDS APPOINTMENT. 04/13/23   Cydney Draft, MD  valACYclovir  (VALTREX ) 1000 MG tablet Take 1 tablet (1,000 mg total) by mouth 2 (two) times daily as needed. 02/19/22   Cydney Draft, MD    Family History Family History  Problem Relation Age of Onset   Hypertension Mother    Cancer - Other Father        biliary   Hypertension Sister    Hypertension Brother    Hypertension Brother    Melanoma Brother     Social History Social History  Tobacco Use   Smoking status: Former    Current packs/day: 0.00    Types: Cigarettes    Quit date: 01/07/1981    Years since quitting: 42.4   Smokeless tobacco: Former  Substance Use Topics   Alcohol use: Yes    Alcohol/week: 6.0 standard drinks of alcohol    Types: 2 Glasses of wine, 2 Cans of beer, 2 Shots of liquor per week    Comment: 7-8 drinks a week of beer/wine or liquor   Drug use: No     Allergies   Patient has no known allergies.   Review of Systems Review of Systems  HENT:  Positive for congestion and postnasal drip.   Respiratory:  Positive for cough.      Physical Exam Triage Vital Signs ED Triage Vitals  Encounter  Vitals Group     BP      Systolic BP Percentile      Diastolic BP Percentile      Pulse      Resp      Temp      Temp src      SpO2      Weight      Height      Head Circumference      Peak Flow      Pain Score      Pain Loc      Pain Education      Exclude from Growth Chart    No data found.  Updated Vital Signs BP 135/89 (BP Location: Right Arm)   Pulse 94   Temp 98.4 F (36.9 C) (Oral)   Resp 17   SpO2 99%    Physical Exam Vitals and nursing note reviewed.  Constitutional:      Appearance: Normal appearance. He is normal weight. He is ill-appearing.  HENT:     Head: Atraumatic.     Right Ear: Tympanic membrane, ear canal and external ear normal.     Left Ear: Tympanic membrane, ear canal and external ear normal.     Nose: Nose normal.     Mouth/Throat:     Mouth: Mucous membranes are moist.     Pharynx: Oropharynx is clear.  Eyes:     Extraocular Movements: Extraocular movements intact.     Conjunctiva/sclera: Conjunctivae normal.     Pupils: Pupils are equal, round, and reactive to light.  Cardiovascular:     Rate and Rhythm: Normal rate and regular rhythm.     Pulses: Normal pulses.     Heart sounds: Normal heart sounds.  Pulmonary:     Effort: Pulmonary effort is normal.     Breath sounds: Normal breath sounds. No wheezing, rhonchi or rales.     Comments: Frequent nonproductive cough on exam Musculoskeletal:        General: Normal range of motion.     Cervical back: Normal range of motion and neck supple.  Skin:    General: Skin is warm and dry.  Neurological:     General: No focal deficit present.     Mental Status: He is alert and oriented to person, place, and time. Mental status is at baseline.  Psychiatric:        Mood and Affect: Mood normal.        Behavior: Behavior normal.      UC Treatments / Results  Labs (all labs ordered are listed, but only abnormal results are displayed) Labs Reviewed  POCT INFLUENZA A/B  POC SARS  CORONAVIRUS 2 AG -  ED    EKG   Radiology No results found.  Procedures Procedures (including critical care time)  Medications Ordered in UC Medications - No data to display  Initial Impression / Assessment and Plan / UC Course  I have reviewed the triage vital signs and the nursing notes.  Pertinent labs & imaging results that were available during my care of the patient were reviewed by me and considered in my medical decision making (see chart for details).     MDM: 1.  Acute URI-Augmentin  875/125 mg tablet: Take 1 tablet twice daily x 7 days; 2.  Cough, unspecified type-Rx'd prednisone  20 mg tablet: Take 3 tablets p.o. daily x 5 days; 3.  Fever, unspecified type-Influenza A/B negative, COVID-19 negative. Advised patient to take medications as directed with food to completion.  Advised patient to take prednisone  with first dose of Augmentin  for the next 5 of 7 days. Encouraged to increase daily water intake to 64 ounces per day while taking these medications.  Advised if symptoms worsen and/or unresolved please follow-up with your PCP or here for further evaluation.  Patient discharged home, hemodynamically stable. Final Clinical Impressions(s) / UC Diagnoses   Final diagnoses:  Fever, unspecified  Acute URI  Cough, unspecified type     Discharge Instructions      Advised patient to take medications as directed with food to completion.  Advised patient to take prednisone  with first dose of Augmentin  for the next 5 of 7 days. Encouraged to increase daily water intake to 64 ounces per day while taking these medications.  Advised if symptoms worsen and/or unresolved please follow-up with your PCP or here for further evaluation.     ED Prescriptions     Medication Sig Dispense Auth. Provider   amoxicillin -clavulanate (AUGMENTIN ) 875-125 MG tablet Take 1 tablet by mouth every 12 (twelve) hours. 14 tablet Harlow Basley, FNP   predniSONE  (DELTASONE ) 20 MG tablet Take 3 tabs  PO daily x 5 days. 15 tablet Elgene Coral, FNP      I have reviewed the PDMP during this encounter.   Leonides Ramp, FNP 06/23/23 1050

## 2023-06-23 NOTE — Discharge Instructions (Addendum)
 Advised patient to take medications as directed with food to completion.  Advised patient to take prednisone with first dose of Augmentin for the next 5 of 7 days.  Encouraged to increase daily water intake to 64 ounces per day while taking these medications.  Advised if symptoms worsen and/or unresolved please follow-up with your PCP or here for further evaluation.

## 2023-07-10 ENCOUNTER — Other Ambulatory Visit: Payer: Self-pay | Admitting: Family Medicine

## 2023-07-10 DIAGNOSIS — J309 Allergic rhinitis, unspecified: Secondary | ICD-10-CM

## 2023-08-04 ENCOUNTER — Other Ambulatory Visit: Payer: Self-pay | Admitting: Family Medicine

## 2023-08-04 DIAGNOSIS — J309 Allergic rhinitis, unspecified: Secondary | ICD-10-CM

## 2023-09-02 DIAGNOSIS — H2513 Age-related nuclear cataract, bilateral: Secondary | ICD-10-CM | POA: Diagnosis not present

## 2023-09-11 ENCOUNTER — Other Ambulatory Visit: Payer: Self-pay | Admitting: Family Medicine

## 2023-10-07 ENCOUNTER — Other Ambulatory Visit: Payer: Self-pay | Admitting: Family Medicine

## 2023-10-07 DIAGNOSIS — I10 Essential (primary) hypertension: Secondary | ICD-10-CM

## 2023-10-11 NOTE — Telephone Encounter (Signed)
 Pt needs an appointment for bp f/u

## 2023-10-12 NOTE — Telephone Encounter (Signed)
 Is this with the provider or nurse?

## 2023-10-12 NOTE — Telephone Encounter (Signed)
 Patient is scheduled on Thursday August 14th at 10:50am

## 2023-10-14 ENCOUNTER — Encounter: Payer: Self-pay | Admitting: Family Medicine

## 2023-10-14 ENCOUNTER — Ambulatory Visit: Admitting: Family Medicine

## 2023-10-14 VITALS — BP 130/78 | HR 71 | Ht 71.0 in | Wt 209.3 lb

## 2023-10-14 DIAGNOSIS — Z1211 Encounter for screening for malignant neoplasm of colon: Secondary | ICD-10-CM

## 2023-10-14 DIAGNOSIS — R7401 Elevation of levels of liver transaminase levels: Secondary | ICD-10-CM | POA: Diagnosis not present

## 2023-10-14 DIAGNOSIS — R0982 Postnasal drip: Secondary | ICD-10-CM | POA: Insufficient documentation

## 2023-10-14 DIAGNOSIS — I10 Essential (primary) hypertension: Secondary | ICD-10-CM | POA: Diagnosis not present

## 2023-10-14 DIAGNOSIS — Z125 Encounter for screening for malignant neoplasm of prostate: Secondary | ICD-10-CM | POA: Diagnosis not present

## 2023-10-14 DIAGNOSIS — L659 Nonscarring hair loss, unspecified: Secondary | ICD-10-CM

## 2023-10-14 MED ORDER — FINASTERIDE 5 MG PO TABS: 5.0000 mg | ORAL_TABLET | Freq: Every day | ORAL | 2 refills | Status: AC

## 2023-10-14 MED ORDER — LISINOPRIL 40 MG PO TABS: 40.0000 mg | ORAL_TABLET | Freq: Every day | ORAL | 3 refills | Status: AC

## 2023-10-14 NOTE — Assessment & Plan Note (Addendum)
 Well controlled. Goal under 130.  Continue current regimen. Follow up in  1 yr

## 2023-10-14 NOTE — Assessment & Plan Note (Signed)
 Does well with astelin  and xyzal.  They are covering under insurance.

## 2023-10-14 NOTE — Progress Notes (Signed)
 Pt declined Prevnar 20 vaccine.

## 2023-10-14 NOTE — Progress Notes (Signed)
   Established Patient Office Visit  Subjective  Patient ID: Ethan Bennett, male    DOB: 05/17/1960  Age: 63 y.o. MRN: [id number]  Chief Complaint  Patient presents with   Hypertension   Medication Refill    HPI  Hypertension- Pt denies chest pain, SOB, dizziness, or heart palpitations.  Taking meds as directed w/o problems.  Denies medication side effects.       ROS    Objective:     BP 130/78   Pulse 71   Ht 5' 11 (1.803 m)   Wt 209 lb 4.8 oz (94.9 kg)   SpO2 100%   BMI 29.19 kg/m    Physical Exam Vitals and nursing note reviewed.  Constitutional:      Appearance: Normal appearance.  HENT:     Head: Normocephalic and atraumatic.  Eyes:     Conjunctiva/sclera: Conjunctivae normal.  Cardiovascular:     Rate and Rhythm: Normal rate and regular rhythm.  Pulmonary:     Effort: Pulmonary effort is normal.     Breath sounds: Normal breath sounds.  Skin:    General: Skin is warm and dry.  Neurological:     Mental Status: He is alert.  Psychiatric:        Mood and Affect: Mood normal.      No results found for any visits on 10/14/23.    The 10-year ASCVD risk score (Arnett DK, et al., 2019) is: 8.7%    Assessment & Plan:   Problem List Items Addressed This Visit       Cardiovascular and Mediastinum   Essential hypertension - Primary   Well controlled. Goal under 130.  Continue current regimen. Follow up in  1 yr      Relevant Medications   lisinopril  (ZESTRIL ) 40 MG tablet   Other Relevant Orders   CMP14+EGFR   Lipid panel   CBC     Other   Post-nasal drainage   Does well with astelin  and xyzal.  They are covering under insurance.        Hair loss   Continue finasteride .       Relevant Medications   finasteride  (PROSCAR ) 5 MG tablet (Start on 11/14/2023)   Other Visit Diagnoses       Elevated ALT measurement       Relevant Orders   CMP14+EGFR   Lipid panel   CBC     Screening for malignant neoplasm of prostate        Relevant Orders   PSA     Screening for colon cancer          Encouraged him to consider getting the pneumonia vaccine.  He has already reached out to schedule a colonscopy  Return in about 1 year (around 10/13/2024) for Hypertension.    Dorothyann Byars, MD

## 2023-10-14 NOTE — Assessment & Plan Note (Signed)
 -  Continue finasteride

## 2023-10-15 ENCOUNTER — Ambulatory Visit: Payer: Self-pay | Admitting: Family Medicine

## 2023-10-15 LAB — CMP14+EGFR
ALT: 28 IU/L (ref 0–44)
AST: 22 IU/L (ref 0–40)
Albumin: 4.6 g/dL (ref 3.9–4.9)
Alkaline Phosphatase: 84 IU/L (ref 44–121)
BUN/Creatinine Ratio: 17 (ref 10–24)
BUN: 17 mg/dL (ref 8–27)
Bilirubin Total: 0.5 mg/dL (ref 0.0–1.2)
CO2: 23 mmol/L (ref 20–29)
Calcium: 9.5 mg/dL (ref 8.6–10.2)
Chloride: 101 mmol/L (ref 96–106)
Creatinine, Ser: 0.99 mg/dL (ref 0.76–1.27)
Globulin, Total: 2.4 g/dL (ref 1.5–4.5)
Glucose: 82 mg/dL (ref 70–99)
Potassium: 4.5 mmol/L (ref 3.5–5.2)
Sodium: 141 mmol/L (ref 134–144)
Total Protein: 7 g/dL (ref 6.0–8.5)
eGFR: 86 mL/min/1.73 (ref >59)

## 2023-10-15 LAB — CBC
Hematocrit: 46.3 % (ref 37.5–51.0)
Hemoglobin: 14.6 g/dL (ref 13.0–17.7)
MCH: 29.6 pg (ref 26.6–33.0)
MCHC: 31.5 g/dL (ref 31.5–35.7)
MCV: 94 fL (ref 79–97)
Platelets: 227 x10E3/uL (ref 150–450)
RBC: 4.94 x10E6/uL (ref 4.14–5.80)
RDW: 13.4 % (ref 11.6–15.4)
WBC: 7.1 x10E3/uL (ref 3.4–10.8)

## 2023-10-15 LAB — LIPID PANEL
Chol/HDL Ratio: 2.4 ratio (ref 0.0–5.0)
Cholesterol, Total: 191 mg/dL (ref 100–199)
HDL: 78 mg/dL (ref >39)
LDL Chol Calc (NIH): 103 mg/dL — ABNORMAL HIGH (ref 0–99)
Triglycerides: 50 mg/dL (ref 0–149)
VLDL Cholesterol Cal: 10 mg/dL (ref 5–40)

## 2023-10-15 LAB — PSA: Prostate Specific Ag, Serum: <0.1 ng/mL (ref 0.0–4.0)

## 2023-10-15 NOTE — Progress Notes (Signed)
 Hi Ethan Bennett, LDL cholesterol is.  It was 62 last year its up to 103 so up about 40 points.  Have you made any changes with the Lipitor or ran out recently.  Just can urged you to continue to work on healthy Mediterranean diet and exercise for 30 minutes 5 days a week and can junction with taking the atorvastatin .  Additional labs are normal.

## 2023-11-02 ENCOUNTER — Encounter: Payer: Self-pay | Admitting: Sports Medicine

## 2023-11-04 DIAGNOSIS — H43821 Vitreomacular adhesion, right eye: Secondary | ICD-10-CM | POA: Diagnosis not present

## 2023-11-04 DIAGNOSIS — H43812 Vitreous degeneration, left eye: Secondary | ICD-10-CM | POA: Diagnosis not present

## 2023-11-04 DIAGNOSIS — D3131 Benign neoplasm of right choroid: Secondary | ICD-10-CM | POA: Diagnosis not present

## 2024-01-07 ENCOUNTER — Telehealth: Payer: Self-pay | Admitting: Internal Medicine

## 2024-01-07 ENCOUNTER — Encounter: Payer: Self-pay | Admitting: Family Medicine

## 2024-01-07 ENCOUNTER — Ambulatory Visit

## 2024-01-07 DIAGNOSIS — M67441 Ganglion, right hand: Secondary | ICD-10-CM

## 2024-01-07 NOTE — Telephone Encounter (Signed)
 Schedule him with Dr. Charles

## 2024-01-07 NOTE — Telephone Encounter (Signed)
 Patient made an appointment today with concern of ganglion cyst that he is requesting drainage for. I called patient to discuss that drainage is not something that we can offer at urgent care but we are happy to assess the possible cyst and make a referral to general surgery for him. Patient verbalized understanding and stated that he would try to reach out to his PCP to see if they can drain it.

## 2024-01-12 ENCOUNTER — Other Ambulatory Visit: Payer: Self-pay

## 2024-01-12 ENCOUNTER — Ambulatory Visit

## 2024-01-12 VITALS — BP 130/80 | Ht 71.0 in | Wt 209.0 lb

## 2024-01-12 DIAGNOSIS — M67441 Ganglion, right hand: Secondary | ICD-10-CM | POA: Diagnosis not present

## 2024-01-12 DIAGNOSIS — M67449 Ganglion, unspecified hand: Secondary | ICD-10-CM

## 2024-01-12 MED ORDER — METHYLPREDNISOLONE ACETATE 40 MG/ML IJ SUSP
40.0000 mg | Freq: Once | INTRAMUSCULAR | Status: AC
  Administered 2024-01-12: 20 mg via INTRA_ARTICULAR

## 2024-01-12 NOTE — Progress Notes (Signed)
   Subjective:    Patient ID: Ethan Bennett, male    DOB: 63 y.o., 1960/11/08   MRN: [id number]  Chief Complaint: Cyst of right index finger  Discussed the use of AI scribe software for clinical note transcription with the patient, who gave verbal consent to proceed.  Ethan Bennett is a 63 year old right-hand-dominant male past medical history significant for hypertension, GERD, adjustment disorder presenting with a cyst of his right index finger. History of Present Illness Ethan Bennett is a 63 year old male who presents with a digital mucoid cyst on his index finger.  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  There does appear to be an early inkling of nailbed involvement with altered nail contour distal to this.  Limited ultrasound exam of the right index finger: Hypoechogenic fluid signal which appears well-demarcated and has some small scant hyper echogenic foci present within its lumen on the dorsal aspect of the distal portion of the DIP joint which appears to have a communicating stalk/tract with the joint itself.  This cystic-appearing formation occupies a space in very close proximity to the the proximal nailbed.    Interpretation: Digital mucous cyst  Right index finger digital mucous cyst Aspiration with Ultrasound Guidance Ethan Bennett [birth year] Indications: Pain Procedure Details Following the description of risks including infection bleeding, damage to surrounding structures, atrophy, and hypo-pigmentation, patient provided written consent for R index finger DIP joint digital mucous cyst aspiration with ultrasound guidance. Ultrasound was used to visualize the ganglion cyst. Patient was sterilely prepped in the usual fashion with alcohol.  Following topical anesthetization with ethyl chloride, patient was anesthetized with 2cc Lidocaine  2% buffered with 0.25cc Sodium Bicarbonate 8.4%. After allowing for anesthetization, aspiration was performed with 18 g needle on a 5 ml syringe with clear gel removed. Patient tolerated well without complication.  Provided a 20 mg Depo-Medrol steroid dose intra-articularly following this.  This was well visualized under ultrasound, please see associated photographic documentation. Precautions provided. Cleaned and dressing applied.     Assessment & Plan:   Assessment & Plan Digital mucoid (ganglion) cyst of right index finger associated with osteoarthritis   The digital mucoid cyst on the right index finger is likely due to osteoarthritis, causing fluid accumulation. The cyst is enlarging and nearing the nail, which may lead to nail disruption. Surgical excision may be necessary if the cyst recurs after drainage, as the recurrence rate after drainage is nearly 100%, but significantly lower with surgical removal. An ultrasound was performed to guide needle placement, and the cyst was drained. A steroid was injected into the joint to reduce fluid accumulation. He was advised against soaking the finger for 24 hours post-procedure.

## 2024-02-01 ENCOUNTER — Other Ambulatory Visit: Payer: Self-pay | Admitting: Family Medicine

## 2024-02-01 DIAGNOSIS — J309 Allergic rhinitis, unspecified: Secondary | ICD-10-CM

## 2024-02-07 DIAGNOSIS — K648 Other hemorrhoids: Secondary | ICD-10-CM | POA: Diagnosis not present

## 2024-02-07 DIAGNOSIS — K5730 Diverticulosis of large intestine without perforation or abscess without bleeding: Secondary | ICD-10-CM | POA: Diagnosis not present

## 2024-02-07 DIAGNOSIS — Z1211 Encounter for screening for malignant neoplasm of colon: Secondary | ICD-10-CM | POA: Diagnosis not present

## 2024-02-07 DIAGNOSIS — D124 Benign neoplasm of descending colon: Secondary | ICD-10-CM | POA: Diagnosis not present

## 2024-02-16 DIAGNOSIS — M25561 Pain in right knee: Secondary | ICD-10-CM | POA: Diagnosis not present

## 2024-02-29 ENCOUNTER — Encounter: Payer: Self-pay | Admitting: Family Medicine

## 2024-02-29 MED ORDER — VALACYCLOVIR HCL 1 G PO TABS: 1000.0000 mg | ORAL_TABLET | Freq: Every day | ORAL | 3 refills | Status: AC

## 2024-02-29 NOTE — Telephone Encounter (Signed)
 Requesting rx rf of Valtrex  1000mg   Last written 02/19/2022 Last OV 10/14/2023 Upcoming appt = none
